# Patient Record
Sex: Male | Born: 1955 | Race: White | Hispanic: No | Marital: Married | Smoking: Former smoker
Health system: Southern US, Community
[De-identification: ages and names within clinical notes are randomized; demographics above are authoritative.]

## PROBLEM LIST (undated history)

## (undated) DIAGNOSIS — E119 Type 2 diabetes mellitus without complications: Secondary | ICD-10-CM

## (undated) DIAGNOSIS — K219 Gastro-esophageal reflux disease without esophagitis: Secondary | ICD-10-CM

## (undated) DIAGNOSIS — E782 Mixed hyperlipidemia: Secondary | ICD-10-CM

## (undated) DIAGNOSIS — F329 Major depressive disorder, single episode, unspecified: Secondary | ICD-10-CM

## (undated) DIAGNOSIS — R0600 Dyspnea, unspecified: Secondary | ICD-10-CM

## (undated) DIAGNOSIS — I10 Essential (primary) hypertension: Secondary | ICD-10-CM

## (undated) DIAGNOSIS — E785 Hyperlipidemia, unspecified: Secondary | ICD-10-CM

## (undated) DIAGNOSIS — I209 Angina pectoris, unspecified: Secondary | ICD-10-CM

## (undated) DIAGNOSIS — H905 Unspecified sensorineural hearing loss: Secondary | ICD-10-CM

## (undated) DIAGNOSIS — I2510 Atherosclerotic heart disease of native coronary artery without angina pectoris: Secondary | ICD-10-CM

## (undated) DIAGNOSIS — G4733 Obstructive sleep apnea (adult) (pediatric): Secondary | ICD-10-CM

## (undated) DIAGNOSIS — G4730 Sleep apnea, unspecified: Secondary | ICD-10-CM

## (undated) DIAGNOSIS — K449 Diaphragmatic hernia without obstruction or gangrene: Secondary | ICD-10-CM

## (undated) DIAGNOSIS — E7800 Pure hypercholesterolemia, unspecified: Secondary | ICD-10-CM

## (undated) DIAGNOSIS — H9319 Tinnitus, unspecified ear: Secondary | ICD-10-CM

## (undated) DIAGNOSIS — N529 Male erectile dysfunction, unspecified: Secondary | ICD-10-CM

## (undated) DIAGNOSIS — J189 Pneumonia, unspecified organism: Secondary | ICD-10-CM

## (undated) DIAGNOSIS — I700 Atherosclerosis of aorta: Secondary | ICD-10-CM

## (undated) DIAGNOSIS — R002 Palpitations: Secondary | ICD-10-CM

## (undated) DIAGNOSIS — E878 Other disorders of electrolyte and fluid balance, not elsewhere classified: Secondary | ICD-10-CM

## (undated) DIAGNOSIS — R011 Cardiac murmur, unspecified: Secondary | ICD-10-CM

## (undated) HISTORY — PX: OTHER SURGICAL HISTORY: SHX169

## (undated) HISTORY — PX: VASECTOMY: SHX75

## (undated) HISTORY — PX: COLONOSCOPY: SHX174

---

## 2011-07-17 ENCOUNTER — Emergency Department: Payer: Self-pay | Admitting: Emergency Medicine

## 2011-07-20 ENCOUNTER — Inpatient Hospital Stay: Payer: Self-pay | Admitting: Internal Medicine

## 2012-02-18 ENCOUNTER — Emergency Department: Payer: Self-pay | Admitting: Emergency Medicine

## 2012-02-18 LAB — URINALYSIS, COMPLETE
Glucose,UR: NEGATIVE mg/dL (ref 0–75)
Ketone: NEGATIVE
Leukocyte Esterase: NEGATIVE
Nitrite: NEGATIVE
Ph: 7 (ref 4.5–8.0)
Protein: NEGATIVE
RBC,UR: <1 /HPF (ref 0–5)
Squamous Epithelial: NONE SEEN

## 2013-05-25 IMAGING — CT CT ABD-PELV W/O CM
1 of 2 series · 15 of 32 positions shown, 19 images · non-contrast
Comparison: none

REASON FOR EXAM: (1) lower abd/pel celulitis tx with antibiotics post
[DATE]  lower mid /right abd
COMMENTS:

PROCEDURE:     CT  - CT ABDOMEN AND PELVIS W[DATE] [DATE]
RESULT:
TECHNIQUE: Helical noncontrast 3 mm sections were obtained from the lung
bases through the pubic symphysis.

[Series 2: 3mm soft tissue · axial · 0.68mm/px · z∈[-436,-20]mm · 15 of 153 slices shown, 19 images]
[im 7/153  soft-tissue]
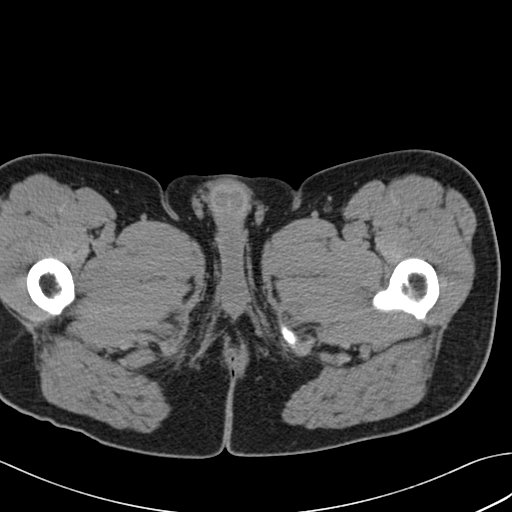
[im 7/153  bone]
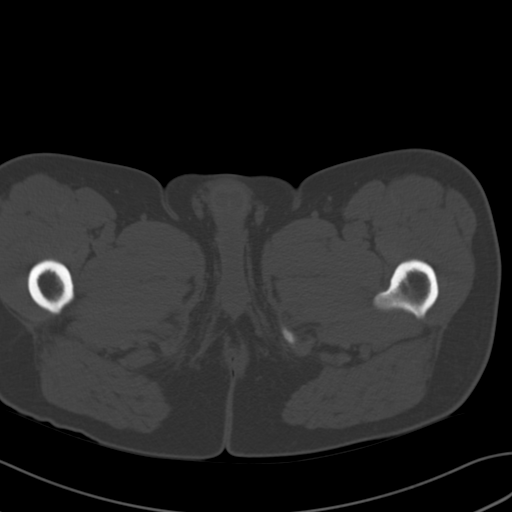
[im 21/153  soft-tissue]
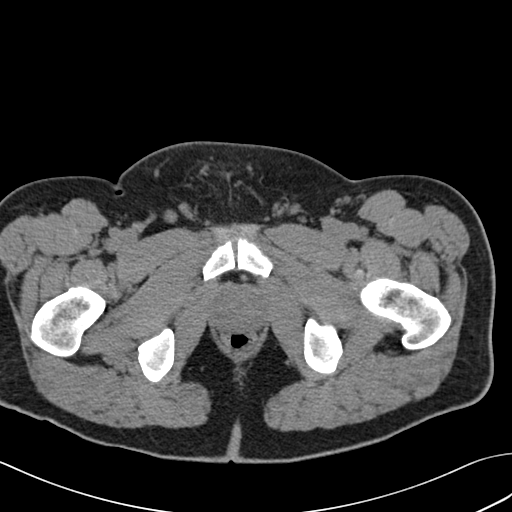
[im 35/153  soft-tissue]
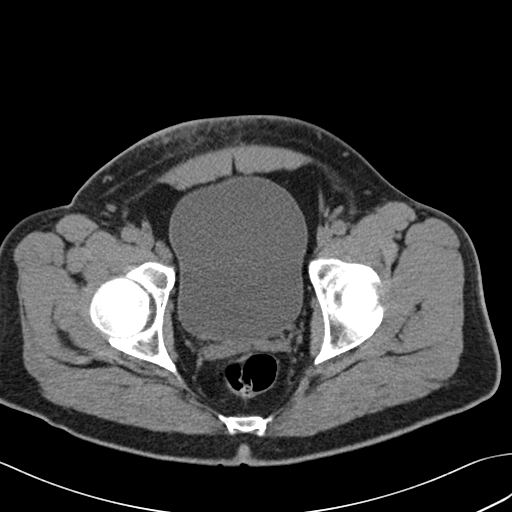
[im 42/153  soft-tissue]
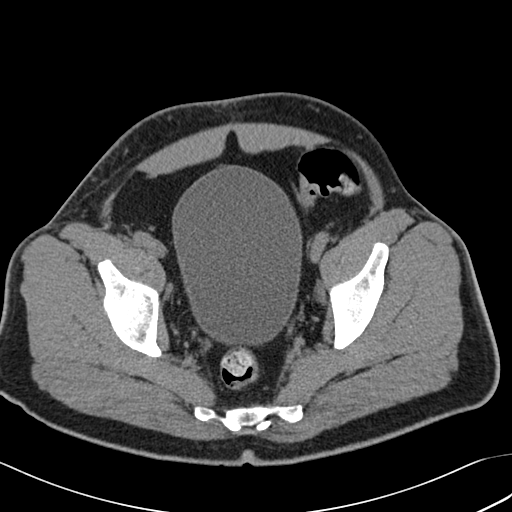
[im 56/153  soft-tissue]
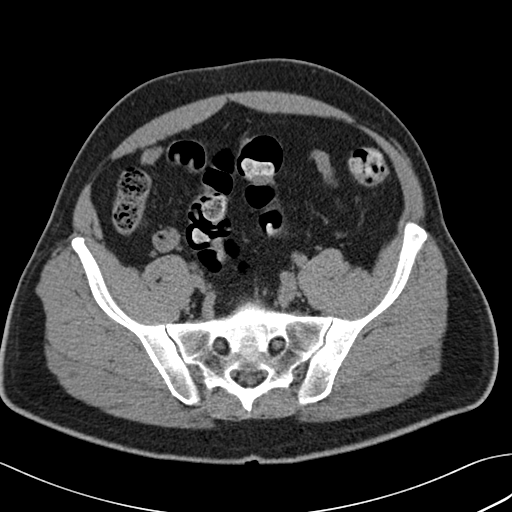
[im 63/153  soft-tissue]
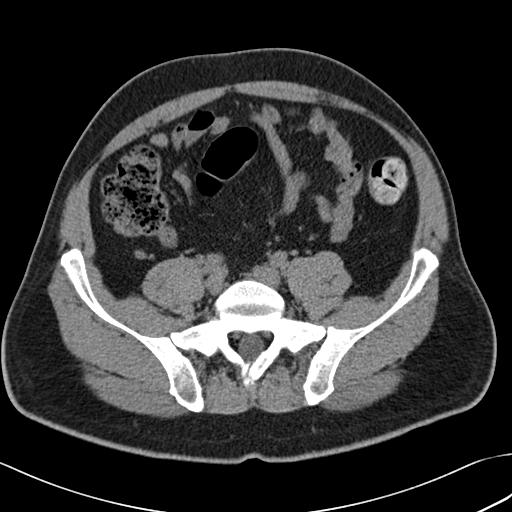
[im 77/153  soft-tissue]
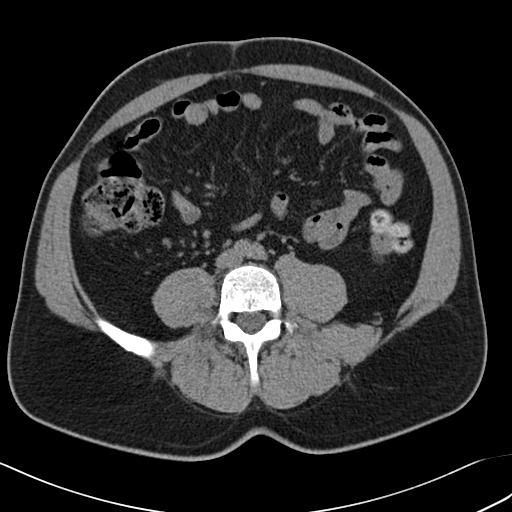
[im 90/153  soft-tissue]
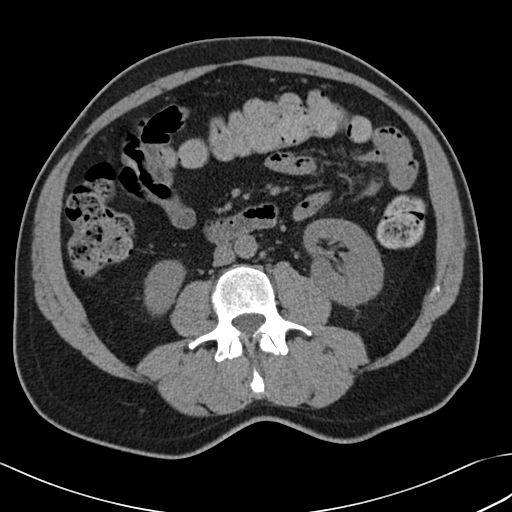
[im 97/153  soft-tissue]
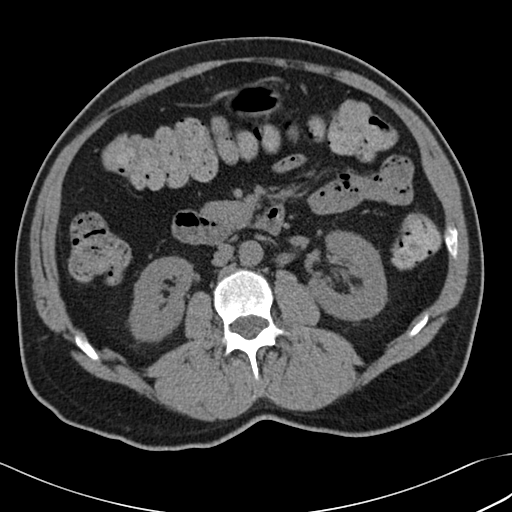
[im 97/153  bone]
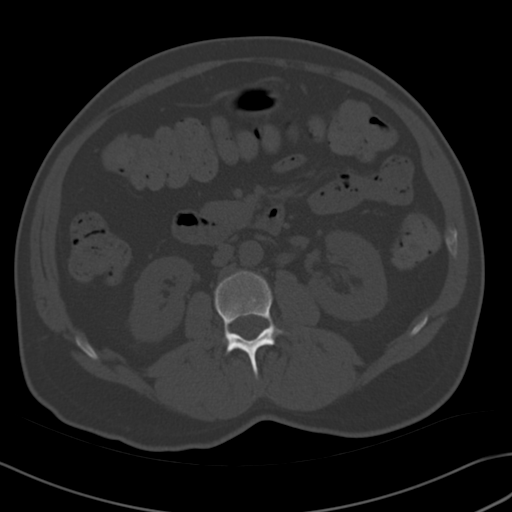
[im 111/153  soft-tissue]
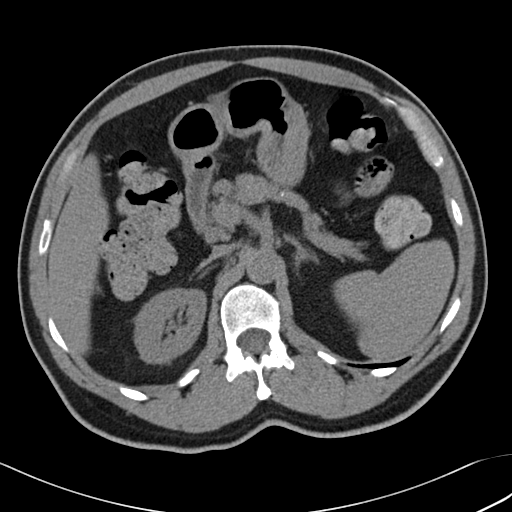
[im 118/153  soft-tissue]
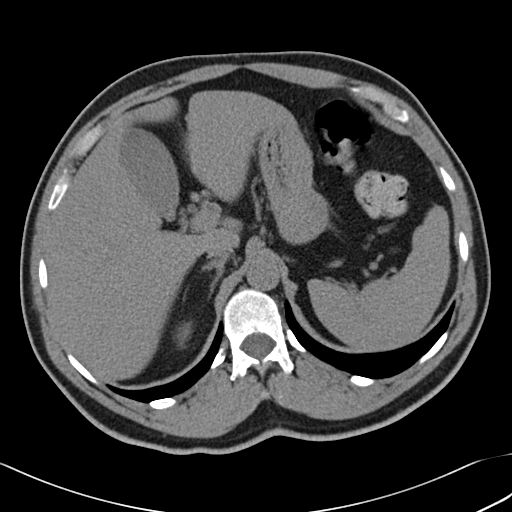
[im 125/153  lung]
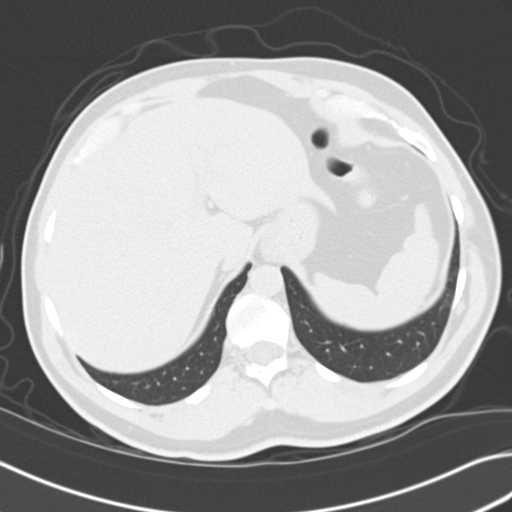
[im 132/153  soft-tissue]
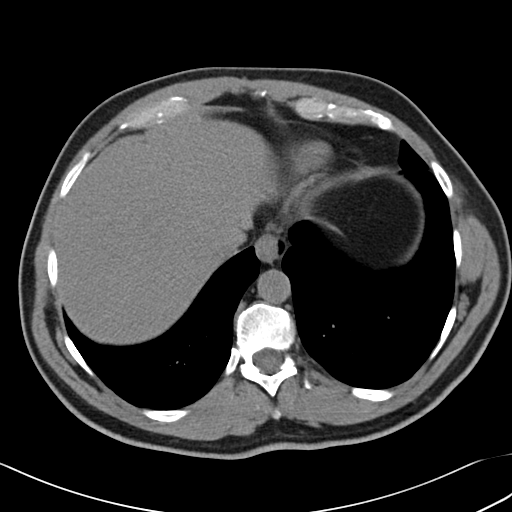
[im 132/153  lung]
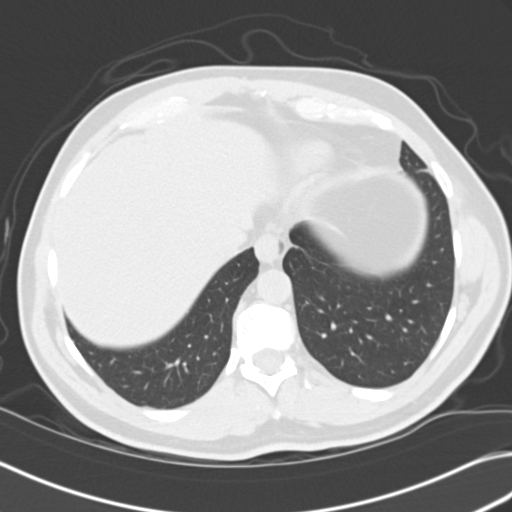
[im 139/153  lung]
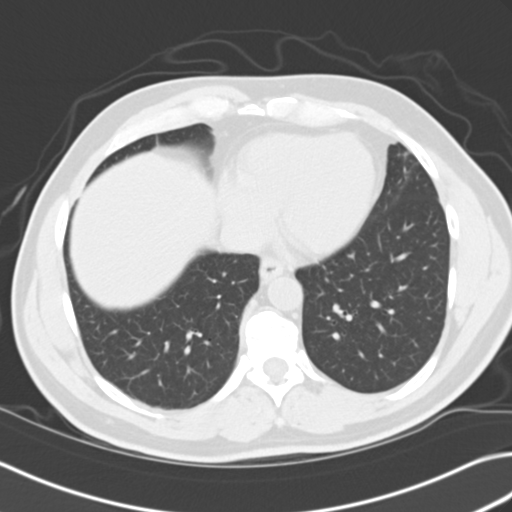
[im 146/153  soft-tissue]
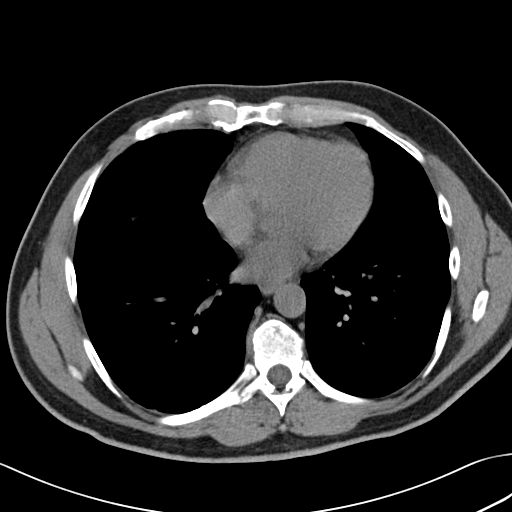
[im 146/153  lung]
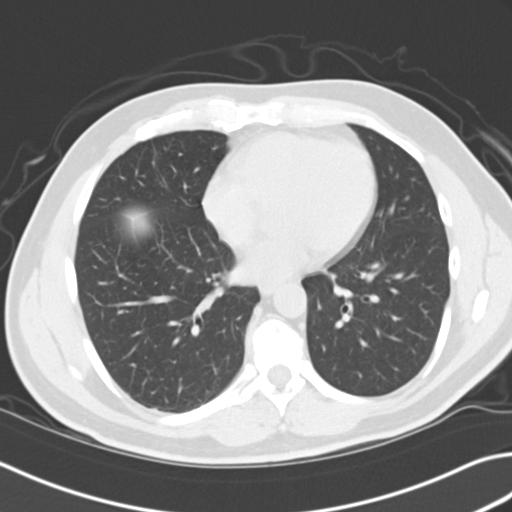

[15 of 32 positions shown; findings below may reference images not displayed]

FINDINGS: There are findings which may represent a small, 2 mm nodule within
the left lung base. The lung bases are otherwise unremarkable.

Noncontrast evaluation of the liver, spleen, adrenals, and pancreas is
unremarkable. A nonobstructing, small calculus is identified within the
medullary portion of the left kidney. The kidneys are otherwise
unremarkable. The appendix is identified and is unremarkable. There is no CT
evidence of bowel obstruction or secondary signs reflecting enteritis,
colitis, diverticulitis or appendicitis. A very small, fat containing,
umbilical hernia is identified.

Within the anterior lower right paracentral abdominal wall, a subcutaneous,
loculated, low attenuating focus is identified measuring 1.7 cm in diameter.
This may represent a small, subcutaneous abscess. There is inflammation of
the surrounding mesenteric fat.
IMPRESSION: 1.     Very small focus of induration and likely fluid collection in the
subcutaneous fat of the lower abdomen in the region of palpable concern.
There is surrounding mild cellulitis.
2.     Nonobstructing calculi.
3.     Very small, umbilical hernia.
4.     No further focal or acute abnormalities.
5.     Dr. Tiri of the Emergency Department was informed of these
findings via a preliminary faxed report.

## 2014-11-14 NOTE — Discharge Summary (Signed)
PATIENT NAME:  Danny Brown, Danny Brown MR#:  [id number] DATE OF BIRTH:  07-18-1956  DATE OF ADMISSION:  07/20/2011 DATE OF DISCHARGE:  07/21/2011  ADMITTING DIAGNOSIS:  Cellulitis and abscess in right lower abdominal wall.    DISCHARGE DIAGNOSES:   1. Right lower abdominal wall area abscess, status post incision and drainage on July 17, 2011.    2. Cultures pending.  3. Mild dehydration.  4. Renal insufficiency, resolved.   5. Hyperglycemia with hemoglobin A1c 6.6.   6. Hyperlipidemia, LDL 106.   7. History of gastroesophageal reflux disease.   DISCHARGE CONDITION: Stable.   DISCHARGE MEDICATIONS: The patient is to resume his outpatient medication which is: Protonix 40 mg p.o. daily.   ADDITIONAL MEDICATIONS:  1. Zyvox 600 mg p.o. twice daily for 7 days.  2. Augmentin 875 mg p.o. twice daily.  3. Percocet 5/325 mg 1 tablet p.o. every 4 hours as needed.  4. Patient is not to take Bactrim.    DIET:  His diet will be regular.    PHYSICAL ACTIVITY LIMITATIONS: As tolerated.   FOLLOWUP APPOINTMENT: With Dr. Michela Pitcher in 3 days after discharge.    CONSULTANTS: Dr. Excell Seltzer and Dr. Anda Kraft.   RADIOLOGIC STUDIES: CT of abdomen and pelvis without contrast on July 20, 2011 revealed very small focus of induration and likely fluid collection in the subcutaneous fat in the lower abdomen in the region of palpable concern.  There is surrounding mild cellulitis, nonobstructing calculi.  There is a small umbilical hernia. No further focal or acute abnormalities were noted.   The patient is a 59 year old Caucasian male with past medical history significant for history of gastroesophageal reflux disease who presented to the hospital with complaints of fevers, chills, as well as low anterolateral abdominal wall area pain and swelling.  Please refer to Dr. Elsie Saas Patel's admission on July 20, 2011.  Apparently the patient has been having symptoms for approximately 1 week. He was evaluated in the emergency  room on July 17, 2011 and noted to have cellulitis as well as abscess which was lanced; and wound cultures were taken.  He was discharged from the emergency room with antibiotic therapy with Bactrim, however, he presented back because of increasing swelling on arrival to the emergency room.  The second time his temperature is 98.2, pulse was 98, respiration rate 18, blood pressure 149/76. Saturation was 97% on room air. Physical exam revealed a moderate sized swelling in anterior abdominal wall consistent with abscess in lower part of the abdominal wall suprapubically.    PATIENT'S LABORATORY DATA:  July 19, 2011 showed glucose of 102, sodium 134, otherwise unremarkable BMP. The patient's liver enzymes were remarkable for elevated total protein to 8.6, otherwise unremarkable. CBC was within normal limits. Wound cultures July 19, 2011, did not show any growth.  Wound cultures July 17, 2011, were pending.    The patient was admitted to the hospital. He was started on Zosyn as well as vancomycin. With this therapy patient's condition significantly improved. He was evaluated by Dr. Excell Seltzer and followed by Dr. Anda Kraft.  Patient's wound broke and it drained copious amount of pus.  On day of discharge, July 21, 2011, patient felt satisfactory,  did not complain of any significant discomfort. His vitals were stable with temperature 97.8, pulse 74, respiratory rate 17, blood pressure 133/74, saturation 96% on room air at rest.   The patient was advised to continue antibiotic therapy for 7 more days with Zyvox as well as Augmentin. He is to follow  up with Dr. Michela PitcherEly as outpatient in the next few days and make decisions about changing antibiotics if necessary or discontinue antibiotics as necessary. The patient was rehydrated with IV fluids. Sodium level normalized from 134 to 140 on day of discharge, July 21, 2011.    The patient was noted to be hyperglycemic. The patient's hemoglobin A1c was  checked and was found to be 6.6 and it was suspected that patient has diabetes versus impaired glucose tolerance. It is recommended to get formal glucose tolerance test as outpatient to rule out or confirm diabetes mellitus.     The patient was noted to have mild renal insufficiency. On arrival to the hospital on July 19, 2011, the patient's creatinine was found to be 1.24.  The patient was  rehydrated and the patient's kidney function somewhat improved to 1.22 on the July 21, 2011.  It is recommended to follow the patient's BUN and creatinine levels to ensure that he is well hydrated, especially in view of possible diabetes mellitus concerns.   The patient lipid panel was checked.  Patient was noted to be hyperlipidemic with LDL level of 106. The patient's total cholesterol level was 162 and triglycerides were 152, and low HDL of 26.  The patient was advised to continue low-fat, low-cholesterol diet.    For gastroesophageal reflux disease the patient is to continue proton pump inhibitor.   The patient is being discharged in stable condition with the above-mentioned medications and followup.   TIME SPENT:  40 minutes.    VITAL SIGNS DAY OF DISCHARGE:  Temperature 97.8, pulse 74, respiratory rate 17, blood pressure 133/74, saturation 96% on room air at rest   ____________________________ Danny Caperima Karaline Buresh, MD rv:vtd D: 07/21/2011 19:42:36 ET T: 07/25/2011 13:07:31 ET JOB#: [id number]  cc: Danny Caperima Anvitha Hutmacher, MD, <Dictator> Carmie Endalph L. Ely III, MD Rhylee Pucillo MD ELECTRONICALLY SIGNED 09/02/2011 19:42

## 2014-11-14 NOTE — Consult Note (Signed)
PATIENT NAME:  Danny Brown, Ramone MR#:  [id number] DATE OF BIRTH:  [birth year]  DATE OF CONSULTATION:  07/20/2011  REFERRING PHYSICIAN:   CONSULTING PHYSICIAN:  Adah Salvageichard E. Excell Seltzerooper, MD  CHIEF COMPLAINT: Abdominal wall cellulitis.   HISTORY OF PRESENT ILLNESS: This is a patient who is three days status post incision and drainage of an abdominal wall abscess who presents back to the ER with worsening cellulitis and induration. Dr. Buford DresserFinnell called me to evaluate the patient for possible abscess. She has performed a CT scan which she reviews with me and stating that there is a 1.7 cm undrained fluid collection. Patient states that he drains fluid from this every day by expression. He has had some subjective fevers. He has not taken his temperature.   PAST MEDICAL HISTORY: Nondiabetic.   ALLERGIES: Penicillin.   FAMILY HISTORY: Noncontributory.   REVIEW OF SYSTEMS: A limited problem-focused review of systems was performed and negative with the exception of that mentioned in the history of present illness.   PHYSICAL EXAMINATION:  GENERAL: Healthy-appearing Caucasian male patient.   VITAL SIGNS: Temperature 98.2, pulse 98, respirations 18, blood pressure 149/76, 97% room air sat. Pain scale 0.   HEENT: No scleral icterus.   NECK: No palpable neck nodes.   ABDOMEN: Soft and nontender. There is an area in the suprapubic area measuring approximately 6 x 3 cm of induration without fluctuance. No expressible drainage. It is surrounded by fairly intense erythema and it is tender.   EXTREMITIES: Without edema. Calves are nontender.   NEUROLOGIC: Grossly intact.   LABORATORY, DIAGNOSTIC, AND RADIOLOGICAL DATA: White blood cell count 5.5, hemoglobin and hematocrit 16 and 47. Electrolytes show a sodium of 134, otherwise within normal limits.   ASSESSMENT AND RECOMMENDATIONS: This is a patient with possible small recurrent abscess but definitely has cellulitis, mostly induration. At this point I would  start him on IV antibiotics. Dr. Allena KatzPatel is admitting the patient and I have discussed him with both Dr. Allena KatzPatel and with Dr. Buford DresserFinnell in the ED. I will be happy to follow the patient if he does not promptly improve on IV antibiotics and drainage may be necessary of this very small 1.7 cm abscess.  ____________________________ Adah Salvageichard E. Excell Seltzerooper, MD rec:cms D: 07/20/2011 06:46:01 ET T: 07/20/2011 09:50:27 ET JOB#: [id number]  cc: Adah Salvageichard E. Excell Seltzerooper, MD, <Dictator> Lattie HawICHARD E Kalijah Westfall MD ELECTRONICALLY SIGNED 07/27/2011 12:40

## 2015-07-14 HISTORY — PX: PALATE / UVULA BIOPSY / EXCISION: SUR128

## 2015-09-29 ENCOUNTER — Ambulatory Visit
Admission: EM | Admit: 2015-09-29 | Discharge: 2015-09-29 | Disposition: A | Discharge status: Home / Self Care | Payer: BLUE CROSS/BLUE SHIELD | Attending: Family Medicine | Admitting: Family Medicine

## 2015-09-29 DIAGNOSIS — J111 Influenza due to unidentified influenza virus with other respiratory manifestations: Secondary | ICD-10-CM | POA: Diagnosis not present

## 2015-09-29 HISTORY — DX: Type 2 diabetes mellitus without complications: E11.9

## 2015-09-29 HISTORY — DX: Other disorders of electrolyte and fluid balance, not elsewhere classified: E87.8

## 2015-09-29 LAB — RAPID INFLUENZA A&B ANTIGENS (ARMC ONLY): INFLUENZA A (ARMC): NEGATIVE

## 2015-09-29 LAB — RAPID INFLUENZA A&B ANTIGENS: Influenza B (ARMC): NEGATIVE

## 2015-09-29 MED ORDER — BENZONATATE 100 MG PO CAPS: 200.0000 mg | ORAL_CAPSULE | Freq: Three times a day (TID) | ORAL | Status: DC | PRN

## 2015-09-29 MED ORDER — OSELTAMIVIR PHOSPHATE 75 MG PO CAPS: 75.0000 mg | ORAL_CAPSULE | Freq: Two times a day (BID) | ORAL | Status: DC

## 2015-09-29 MED ORDER — FEXOFENADINE-PSEUDOEPHED ER 180-240 MG PO TB24: 1.0000 | ORAL_TABLET | Freq: Every day | ORAL | Status: DC

## 2015-09-29 NOTE — ED Notes (Signed)
Patient c/o chills, body aches, and fever which all started yesterday morning.

## 2015-09-29 NOTE — Discharge Instructions (Signed)
Influenza, Adult °Influenza (flu) is an infection in the mouth, nose, and throat (respiratory tract) caused by a virus. The flu can make you feel very ill. Influenza spreads easily from person to person (contagious).  °HOME CARE  °· Only take medicines as told by your doctor. °· Use a cool mist humidifier to make breathing easier. °· Get plenty of rest until your fever goes away. This usually takes 3 to 4 days. °· Drink enough fluids to keep your pee (urine) clear or pale yellow. °· Cover your mouth and nose when you cough or sneeze. °· Wash your hands well to avoid spreading the flu. °· Stay home from work or school until your fever has been gone for at least 1 full day. °· Get a flu shot every year. °GET HELP RIGHT AWAY IF:  °· You have trouble breathing or feel short of breath. °· Your skin or nails turn blue. °· You have severe neck pain or stiffness. °· You have a severe headache, facial pain, or earache. °· Your fever gets worse or keeps coming back. °· You feel sick to your stomach (nauseous), throw up (vomit), or have watery poop (diarrhea). °· You have chest pain. °· You have a deep cough that gets worse, or you cough up more thick spit (mucus). °MAKE SURE YOU:  °· Understand these instructions. °· Will watch your condition. °· Will get help right away if you are not doing well or get worse. °  °This information is not intended to replace advice given to you by your health care provider. Make sure you discuss any questions you have with your health care provider. °  °Document Released: 04/17/2008 Document Revised: 07/30/2014 Document Reviewed: 10/08/2011 °Elsevier Interactive Patient Education ©2016 [employer] °Influenza Tests °WHY AM I HAVING THIS TEST? °You may have an influenza test to help your health care provider determine what type of respiratory infection you have. The test may also be used to help determine a treatment plan and to monitor influenza activity within a community. °There are two  types of influenza virus: types A and B. Often, one strain of type A influenza will be the most common type of influenza in a community during flu season. This is typically between the months of October and May. Influenza tests can help determine which strain of influenza type A is occurring most often in the community. °WHAT KIND OF SAMPLE IS TAKEN? °Influenza tests are performed by collecting a small sample of fluids (secretions) from your nose or throat using a cotton swab. Tests performed on nasal secretions are more accurate than tests performed on a sample taken from your throat. °· Rapid influenza tests are available and have become the most frequently used tests for influenza. They are most accurate when completed within the first 48 hours after your symptoms begin. °· Depending on the method, a rapid influenza test may be completed in your health care provider's office in less than 30 minutes. It can also be sent to a lab with the results available the same day. °· Depending on the particular type of test used, it can identify influenza type A, a mixture of types A and B, or differentiate between type A and B. °· Another test that your health care provider may order is a viral culture. This also requires the collection of secretions from your nose or throat. The sample is then sent to a lab for processing. This may take several days to complete. °HOW ARE YOUR TEST RESULTS   REPORTED? °Your test results will be reported as either positive or negative. A false-negative result can occur. A false-negative result is incorrect because it indicates a condition or finding is not present when it is. °It is your responsibility to obtain your test results. Ask the lab or department performing the test when and how you will get your results. °WHAT DO THE RESULTS MEAN? °· A positive test means you have influenza. Tests may further determine the type of influenza you have. °· A negative influenza test result means it is  not likely that you have influenza. °· A false-negative result can occur. False-negative results are more likely to happen at the height of the influenza season. °Talk with your health care provider to discuss your results, treatment options, and if necessary, the need for more tests. Talk with your health care provider if you have any questions about your results. °  °This information is not intended to replace advice given to you by your health care provider. Make sure you discuss any questions you have with your health care provider. °  °Document Released: 04/18/2005 Document Revised: 07/30/2014 Document Reviewed: 11/25/2013 °Elsevier Interactive Patient Education ©2016 [employer] ° °

## 2015-09-29 NOTE — ED Provider Notes (Signed)
CSN: [id number]     Arrival date & time 09/29/15  1257 History   First MD Initiated Contact with Patient 09/29/15 1547    Nurses notes were reviewed. Chief Complaint  Patient presents with  . URI   Patient is here because of said aching and pain fever and general malaise. He states everything started last night and today while at work he got real bad as far symptoms concerned. He reports though that things did get better after taking Tylenol this morning and then later this afternoon. He states that he does feel better right now but another coworker left because the fluid. He has diabetes and hypercholesterolemia. Surgeries  uvula removed. He states he stopped smoking about 1020 years ago.  (Consider location/radiation/quality/duration/timing/severity/associated sxs/prior Treatment) Patient is a 60 y.o. male presenting with URI. The history is provided by the patient. No language interpreter was used.  URI Presenting symptoms: congestion, fever and rhinorrhea   Severity:  Moderate Timing:  Constant Progression:  Worsening Worsened by:  Nothing tried Associated symptoms: myalgias   Associated symptoms: no sneezing and no swollen glands   Risk factors: being elderly and diabetes mellitus   Risk factors: no recent illness and no recent travel     Past Medical History  Diagnosis Date  . Diabetes mellitus without complication (HCC)   . Hypochloremia    Past Surgical History  Procedure Laterality Date  . Uvula removed     History reviewed. No pertinent family history. Social History  Substance Use Topics  . Smoking status: Never Smoker   . Smokeless tobacco: Never Used  . Alcohol Use: Yes     Comment: Seldom    Review of Systems  Constitutional: Positive for fever.  HENT: Positive for congestion and rhinorrhea. Negative for sneezing.   Musculoskeletal: Positive for myalgias.  All other systems reviewed and are negative.   Allergies  Penicillins  Home Medications   Prior  to Admission medications   Medication Sig Start Date End Date Taking? Authorizing Provider  enalapril (VASOTEC) 2.5 MG tablet Take 2.5 mg by mouth daily.   Yes Historical Provider, MD  metFORMIN (GLUCOPHAGE) 500 MG tablet Take 500 mg by mouth daily.   Yes Historical Provider, MD  benzonatate (TESSALON) 100 MG capsule Take 2 capsules (200 mg total) by mouth 3 (three) times daily as needed for cough. 09/29/15   Hassan Rowan, MD  fexofenadine-pseudoephedrine (ALLEGRA-D ALLERGY & CONGESTION) 180-240 MG 24 hr tablet Take 1 tablet by mouth daily. 09/29/15   Hassan Rowan, MD  oseltamivir (TAMIFLU) 75 MG capsule Take 1 capsule (75 mg total) by mouth 2 (two) times daily. 09/29/15   Hassan Rowan, MD   Meds Ordered and Administered this Visit  Medications - No data to display  BP 136/75 mmHg  Pulse 92  Temp(Src) 99.4 F (37.4 C) (Oral)  Resp 18  Ht  (1.6 m)  Wt 155 lb (70.308 kg)  BMI 27.46 kg/m2  SpO2 100% No data found.   Physical Exam  Constitutional: He is oriented to person, place, and time. He appears well-developed.  HENT:  Head: Normocephalic and atraumatic.  Right Ear: Hearing, tympanic membrane, external ear and ear canal normal.  Left Ear: Hearing, tympanic membrane, external ear and ear canal normal.  Nose: Mucosal edema and rhinorrhea present.  Mouth/Throat: Uvula is midline. No dental caries.  Eyes: Conjunctivae are normal. Pupils are equal, round, and reactive to light.  Neck: Normal range of motion. Neck supple.  Cardiovascular: Normal rate and regular  rhythm.   Pulmonary/Chest: Effort normal and breath sounds normal.  Musculoskeletal: Normal range of motion.  Lymphadenopathy:    He has cervical adenopathy.  Neurological: He is alert and oriented to person, place, and time.  Skin: Skin is warm and dry.  Vitals reviewed.   ED Course  Procedures (including critical care time)  Labs Review Labs Reviewed  RAPID INFLUENZA A&B ANTIGENS [facility] ONLY)    Imaging Review No  results found.   Visual Acuity Review  Right Eye Distance:   Left Eye Distance:   Bilateral Distance:    Right Eye Near:   Left Eye Near:    Bilateral Near:      Results for orders placed or performed during the hospital encounter of 09/29/15  Rapid Influenza A&B Antigens ([facility] only)  Result Value Ref Range   Influenza A ([facility]) NEGATIVE NEGATIVE   Influenza B ([facility]) NEGATIVE NEGATIVE    MDM   1. Flu syndrome    Even though flu test was negative will treat him for flulike illness especially since been less than 24 hours since the illness started. Work note for today and tomorrow. Tamiflu 75 mg twice a day Allegra-D if needed and Tessalon Perles 200 mg 2 times a day as needed follow-up PCP not better in 1-2 weeks and should be noted the patient is a diabetic.  Note: This dictation was prepared with Dragon dictation along with smaller phrase technology. Any transcriptional errors that result from this process are unintentional.    Hassan RowanEugene Treyce Spillers, MD 09/29/15 [id number]

## 2022-04-16 ENCOUNTER — Other Ambulatory Visit: Payer: Self-pay | Admitting: Physician Assistant

## 2022-04-16 DIAGNOSIS — M542 Cervicalgia: Secondary | ICD-10-CM

## 2022-04-27 ENCOUNTER — Ambulatory Visit
Admission: RE | Admit: 2022-04-27 | Discharge: 2022-04-27 | Disposition: A | Discharge status: Home / Self Care | Payer: Medicare Other | Source: Ambulatory Visit | Attending: Physician Assistant | Admitting: Physician Assistant

## 2022-04-27 DIAGNOSIS — M542 Cervicalgia: Secondary | ICD-10-CM

## 2022-09-10 ENCOUNTER — Ambulatory Visit: Payer: BC Managed Care – PPO | Attending: Physical Medicine & Rehabilitation

## 2022-09-10 DIAGNOSIS — M542 Cervicalgia: Secondary | ICD-10-CM | POA: Diagnosis present

## 2022-09-10 NOTE — Therapy (Signed)
OUTPATIENT PHYSICAL THERAPY CERVICAL EVALUATION   Patient Name: Danny Brown MRN: [id number] DOB:[birth year], 67 y.o., male Today's Date: 09/10/2022  END OF SESSION:  PT End of Session - 09/10/22 1734     Visit Number 1    PT Start Time 35    PT Stop Time [id number]    PT Time Calculation (min) 45 min    Activity Tolerance Patient tolerated treatment well    Behavior During Therapy WFL for tasks assessed/performed             Past Medical History:  Diagnosis Date   Diabetes mellitus without complication ([place])    Hypochloremia    Past Surgical History:  Procedure Laterality Date   Uvula Removed     There are no problems to display for this patient.   PCP: [facility] [place]  REFERRING PROVIDER: Dr. Girtha Hake, MD  REFERRING DIAG: Cervicalgia  THERAPY DIAG:  Cervicalgia  Rationale for Evaluation and Treatment: Rehabilitation  ONSET DATE: 5-6 years ago  SUBJECTIVE:                                                                                                                                                                                                         SUBJECTIVE STATEMENT: Pt initially saw orthopedist- Dr. Eliberto Ivory for his R shoulder (Dec 2023) and got treatment for R shoulder (injection) and that is doing well.  Pt states that physician took some images of cervical spine and referred pt to Dr. Alba Destine.  He had an epidural injection (he is not sure the exact procedure) and reports he felt better immediately.  Prior to the injection he was having bilateral hand numbness and this has significantly improved.  His neck is feeling much better, just having some mild stiffness.  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  He has some computer screens he has to look between at his work, he stands but can sit if needed.  He uses a hand truck when he  has to move heavy items at work.  He has 2 dogs and enjoys walking them and walking in general. He lives at home with his wife.    PERTINENT HISTORY:  Pt reports having a cervical spine epidural steroid injection on Friday last week.  PAIN:  Are you having pain? No  PRECAUTIONS: None  WEIGHT BEARING RESTRICTIONS: No  FALLS:  Has patient fallen in last 6 months? No  LIVING ENVIRONMENT: Lives with: lives with their spouse Lives in: House/apartment Stairs:  n/a Has following equipment at home: None  OCCUPATION: Works in the parts department at [employer]: to learn a HEP for his neck today that he will help him keep his neck moving and strong.  He would like to perform this independently after today.  NEXT MD VISIT: none scheduled with Dr. Alba Destine, just went on Friday for his follow up.    OBJECTIVE:   DIAGNOSTIC FINDINGS:  Pt had Cervical Spine MRI 04/28/22 prior to his injection.  Per chart review, findings: 1. Right paracentral disc protrusion at C4-5 with resultant mild spinal stenosis. 2. Degenerative spondylosis at C5-6 and C6-7 with resultant mild spinal stenosis, with moderate right C6 foraminal narrowing, with mild left C6 and C7 foraminal stenosis. 3. Multilevel facet hypertrophy as above, most pronounced at C2-3 and C7-T1 on the left. Findings could contribute to neck pain.  Dr. Alba Destine also did her own review of the images (see referral scanned in chart).  PATIENT SURVEYS:  FOTO 78/76  COGNITION: Overall cognitive status: Within functional limits for tasks assessed  SENSATION: WFL  POSTURE: rounded shoulders  PALPATION: (-) TTP cervical spine mm   CERVICAL ROM:   Active ROM A/PROM (deg) eval  Flexion 30  Extension 42  Right lateral flexion   Left lateral flexion   Right rotation 44  Left rotation 43   (Blank rows = not tested)  UPPER EXTREMITY ROM:  Active ROM Right eval Left eval  Shoulder flexion  165 165  Shoulder extension    Shoulder abduction    Shoulder adduction    Shoulder extension    Shoulder internal rotation    Shoulder external rotation    Elbow flexion    Elbow extension    Wrist flexion    Wrist extension    Wrist ulnar deviation    Wrist radial deviation    Wrist pronation    Wrist supination     (Blank rows = not tested)  UPPER EXTREMITY MMT:  MMT Right eval Left eval  Shoulder flexion 5 5  Shoulder extension    Shoulder abduction 5 5  Shoulder adduction    Shoulder extension    Shoulder internal rotation    Shoulder external rotation    Middle trapezius    Lower trapezius    Elbow flexion 5 5  Elbow extension    Wrist flexion    Wrist extension 5 5  Wrist ulnar deviation    Wrist radial deviation    Wrist pronation    Wrist supination    Grip strength 63.8 lb 65.1 lb   (Blank rows = not tested)  CERVICAL SPECIAL TESTS:  In tact and equal sensation to light touch C2-T1 dermatomes 5/5 Cervical myotome testing b/l today Pt has difficulty with performing scapular mm retraction- lacks endurance/motor control Pt has difficulty with performing deep neck flexor mm activation- lacks endurance/motor control   TODAY'S TREATMENT:  DATE: 09/10/22 Therapeutic Exercise:  Cervical Spine ROM: focused on upper cervical flexion with lower cervical extension via a chin tuck + retraction standing at the wall with cues to keep eyes level; 5 sec holds x 10 Self levator scap stretch: 3x10 sec each side Seated thoracic extension in chair x5 Standing "W"  Scapular retraction with ER x 15, green tband Standing row with scapular retraction x15, green tband   PATIENT EDUCATION:  Education details: HEP instruction, taking movement breaks and continuing to incorporate walking into his daily routine; neutral spine for lifting (to avoid straining  neck in a position of extension) Person educated: Patient Education method: Explanation, Tactile cues, Verbal cues, and Handouts Education comprehension: verbalized understanding and returned demonstration  HOME EXERCISE PROGRAM: Access Code: [email or web address] URL: [email or web address] Date: 09/10/2022 Prepared by: Merdis Delay  Exercises - Standing Cervical Retraction  - 1-2 x daily - 7 x weekly - 3 sets - 3 reps - 10 hold - Seated Levator Scapulae Stretch  - 1-2 x daily - 7 x weekly - 3 reps - 10 hold - Shoulder External Rotation and Scapular Retraction with Resistance  - 1 x daily - 4 x weekly - 2 sets - 15 reps - Standing Shoulder Row with Anchored Resistance  - 1 x daily - 4 x weekly - 2 sets - 15 reps  ASSESSMENT:  CLINICAL IMPRESSION: Patient is a pleasant 67 y.o. M who was seen today for physical therapy evaluation and treatment for cervicalgia.  He is very pleased with his current status as he reports full resolution of his neck and UE sx since undergoing cervical spine injection last week.  He is interested in learning a home exercise program today which he plans to continue working on independently.  He does not wish to pursue a formal course of PT at this point in time.  I instructed him on a HEP for cervical spine ROM and cervical spine/postural mm retraining; also discussed body mechanics for cervical spine during daily activities.  He was able to perform exercises correctly and was given handouts with written and visual instructions.  He expressed he is not interested in scheduling additional treatment at this time and will reach out in the future to complete additional visits if necessary.  He is welcome to return again in the future if needed.    OBJECTIVE IMPAIRMENTS: decreased ROM and decreased strength.   ACTIVITY LIMITATIONS:  none  PARTICIPATION LIMITATIONS:  none  PERSONAL FACTORS: Time since onset of injury/illness/exacerbation are also affecting patient's  functional outcome.   REHAB POTENTIAL: Excellent  CLINICAL DECISION MAKING: Stable/uncomplicated  EVALUATION COMPLEXITY: Low   GOALS: Goals reviewed with patient? Yes  SHORT TERM GOALS: Target date: 09/10/22  Pt will be instructed in a HEP for cervical spine ROM and strength which he can perform independently Baseline: completed during today's session, met goal.  No additional goals established. Goal status: INITIAL  LONG TERM GOALS: Target date: No long term goals established as pt would like to work on HEP independently after today.    PLAN:  PT FREQUENCY:  1x visit.  Pt would like to continue with his HEP independently and not schedule a formal follow up today.  PT DURATION: other: 1x visit.  PLANNED INTERVENTIONS: Therapeutic exercises, Therapeutic activity, and Self Care  PLAN FOR NEXT SESSION: No further follow up scheduled; pt states he would like to work on his HEP independently.   Pincus Badder, PT 09/10/2022, 6:04 PM Merdis Delay, PT, DPT, OCS  #  [id number]      

## 2022-09-12 ENCOUNTER — Ambulatory Visit: Payer: BC Managed Care – PPO

## 2022-12-31 ENCOUNTER — Encounter: Payer: Self-pay | Admitting: Urology

## 2022-12-31 ENCOUNTER — Ambulatory Visit (INDEPENDENT_AMBULATORY_CARE_PROVIDER_SITE_OTHER): Payer: BC Managed Care – PPO | Admitting: Urology

## 2022-12-31 VITALS — BP 158/89 | HR 105 | Ht 63.0 in | Wt 165.0 lb

## 2022-12-31 DIAGNOSIS — N529 Male erectile dysfunction, unspecified: Secondary | ICD-10-CM

## 2022-12-31 MED ORDER — TADALAFIL 20 MG PO TABS: ORAL_TABLET | ORAL | 0 refills | Status: DC

## 2022-12-31 NOTE — Progress Notes (Signed)
I, Maysun L Gibbs,acting as a scribe for Riki Altes, MD.,have documented all relevant documentation on the behalf of Riki Altes, MD,as directed by  Riki Altes, MD while in the presence of Riki Altes, MD.  12/31/2022 12:07 PM   Danny Brown 11-15-1955 [id number]  Referring provider: Leim Fabry, MD [address] [place],  [place] [address]  Chief Complaint  Patient presents with   Erectile Dysfunction    HPI: Danny Brown is a 67 y.o. male referred for evaluation of erectile dysfunction.  Greater than 1 year history of the difficulty maintaining an erection.  Typically does not have problems achieving an erection and usually can achieve penetration, though has difficulty maintaining the erection and difficult achieving climax.  Denies pain or curvature with erections.  Does have tiredness, fatigue. Has tried generic Sildenafil, though he has bothersome headache and does not usually take.  When he was taking the sildenafil, saw no improvement.  Organic risk factors include diabetes, hypertension, hypercholesterolemia, and antihypertensive medications.  No prior tobacco use. Also complains of decreased penile sensitivity.   PMH: Past Medical History:  Diagnosis Date   Diabetes mellitus without complication ([facility])    Hypochloremia     Surgical History: Past Surgical History:  Procedure Laterality Date   Uvula Removed      Home Medications:  Allergies as of 12/31/2022       Reactions   Atorvastatin Other (See Comments)   Penicillins Itching        Medication List        Accurate as of December 31, 2022 12:07 PM. If you have any questions, ask your nurse or doctor.          STOP taking these medications    enalapril 2.5 MG tablet Commonly known as: VASOTEC Stopped by: Riki Altes, MD   oseltamivir 75 MG capsule Commonly known as: Tamiflu Stopped by: Riki Altes, MD   sildenafil 50 MG tablet Commonly known as:  VIAGRA Stopped by: Riki Altes, MD       TAKE these medications    benzonatate 100 MG capsule Commonly known as: TESSALON Take 2 capsules (200 mg total) by mouth 3 (three) times daily as needed for cough.   fexofenadine-pseudoephedrine 180-240 MG 24 hr tablet Commonly known as: Allegra-D Allergy & Congestion Take 1 tablet by mouth daily.   metFORMIN 500 MG tablet Commonly known as: GLUCOPHAGE Take 500 mg by mouth daily.   tadalafil 20 MG tablet Commonly known as: CIALIS Take 1 tab 1 hour per to intercourse Started by: Riki Altes, MD   Vilazodone HCl 40 MG Tabs Commonly known as: VIIBRYD Take by mouth.        Allergies:  Allergies  Allergen Reactions   Atorvastatin Other (See Comments)   Penicillins Itching    Social History:  reports that he has never smoked. He has never used smokeless tobacco. He reports current alcohol use. No history on file for drug use.   Physical Exam: BP (!) 158/89   Pulse (!) 105   Ht 5\' 3"  (1.6 m)   Wt 165 lb (74.8 kg)   BMI 29.23 kg/m   Constitutional:  Alert and oriented, No acute distress. HEENT: [place] AT Respiratory: Normal respiratory effort, no increased work of breathing. GU: Phallus circumcised without lesions. Testes descended bilaterally without massive or tenderness, normal testicular size bilaterally.  Psychiatric: Normal mood and affect.   Assessment & Plan:    1. Erectile  dysfunction We discussed 70% of ED is related to arterial insufficiency. 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  Trial Tadalafil 20 mg, 1 hour prior to intercourse. Further recommendations after lab review  I have reviewed the above documentation for accuracy and completeness, and I agree with the above.   Riki Altes, MD  [facility] [address] [place],  [place] [address] [phone]

## 2022-12-31 NOTE — Patient Instructions (Signed)
[facility] 

## 2023-01-04 ENCOUNTER — Other Ambulatory Visit: Payer: Self-pay | Admitting: *Deleted

## 2023-01-04 DIAGNOSIS — N529 Male erectile dysfunction, unspecified: Secondary | ICD-10-CM

## 2023-01-07 ENCOUNTER — Other Ambulatory Visit: Payer: Medicare Other

## 2023-01-07 DIAGNOSIS — N529 Male erectile dysfunction, unspecified: Secondary | ICD-10-CM

## 2023-01-08 LAB — LUTEINIZING HORMONE: LH: 6.3 m[IU]/mL (ref 1.7–8.6)

## 2023-01-08 LAB — TESTOSTERONE: Testosterone: 327 ng/dL (ref 264–916)

## 2023-01-09 LAB — SPECIMEN STATUS REPORT

## 2023-01-10 LAB — TESTOSTERONE,FREE AND TOTAL
Testosterone, Free: 5.3 pg/mL — ABNORMAL LOW (ref 6.6–18.1)
Testosterone: 323 ng/dL (ref 264–916)

## 2023-01-14 ENCOUNTER — Other Ambulatory Visit: Payer: Self-pay | Admitting: *Deleted

## 2023-01-14 DIAGNOSIS — N529 Male erectile dysfunction, unspecified: Secondary | ICD-10-CM

## 2023-01-14 DIAGNOSIS — R972 Elevated prostate specific antigen [PSA]: Secondary | ICD-10-CM

## 2023-01-17 ENCOUNTER — Other Ambulatory Visit: Payer: Medicare Other

## 2023-01-17 DIAGNOSIS — R972 Elevated prostate specific antigen [PSA]: Secondary | ICD-10-CM

## 2023-01-18 LAB — PSA TOTAL (REFLEX TO FREE): Prostate Specific Ag, Serum: 0.6 ng/mL (ref 0.0–4.0)

## 2023-01-19 ENCOUNTER — Encounter: Payer: Self-pay | Admitting: Urology

## 2023-02-09 LAB — SPECIMEN STATUS REPORT

## 2023-02-09 LAB — TESTOSTERONE,FREE AND TOTAL
Testosterone, Free: 6.7 pg/mL (ref 6.6–18.1)
Testosterone: 418 ng/dL (ref 264–916)

## 2023-02-26 ENCOUNTER — Other Ambulatory Visit: Payer: Self-pay | Admitting: *Deleted

## 2023-02-26 ENCOUNTER — Encounter: Payer: Self-pay | Admitting: Urology

## 2023-02-26 MED ORDER — TADALAFIL 20 MG PO TABS: ORAL_TABLET | ORAL | 1 refills | Status: DC

## 2023-03-21 ENCOUNTER — Encounter: Payer: Self-pay | Admitting: Urology

## 2023-03-21 ENCOUNTER — Ambulatory Visit (INDEPENDENT_AMBULATORY_CARE_PROVIDER_SITE_OTHER): Payer: BC Managed Care – PPO | Admitting: Urology

## 2023-03-21 VITALS — BP 123/73 | HR 88 | Ht 63.0 in | Wt 155.0 lb

## 2023-03-21 DIAGNOSIS — N401 Enlarged prostate with lower urinary tract symptoms: Secondary | ICD-10-CM | POA: Diagnosis not present

## 2023-03-21 DIAGNOSIS — N529 Male erectile dysfunction, unspecified: Secondary | ICD-10-CM | POA: Diagnosis not present

## 2023-03-21 DIAGNOSIS — N5319 Other ejaculatory dysfunction: Secondary | ICD-10-CM | POA: Diagnosis not present

## 2023-03-21 DIAGNOSIS — R972 Elevated prostate specific antigen [PSA]: Secondary | ICD-10-CM

## 2023-03-21 LAB — BLADDER SCAN AMB NON-IMAGING

## 2023-03-21 MED ORDER — TADALAFIL 5 MG PO TABS: 5.0000 mg | ORAL_TABLET | Freq: Every day | ORAL | 3 refills | Status: DC | PRN

## 2023-03-21 MED ORDER — TADALAFIL 20 MG PO TABS: 20.0000 mg | ORAL_TABLET | Freq: Every day | ORAL | 3 refills | Status: DC | PRN

## 2023-03-21 NOTE — Progress Notes (Signed)
03/21/2023 2:17 PM   Danny Brown [birth year] [id number]  Referring provider: [facility] [address] [place],  [place] [address]  Urological history: 1. Erectile dysfunction -contributing factors of age, diabetes, HTN, HLD and BP meds.  -sildenafil - headaches  -tadalafil 20 mg, on-demand-dosing  Chief Complaint  Patient presents with   Dysuria   HPI: Danny Brown is a 67 y.o. male who presents today for urinary hesitancy, weak urinary stream and dysuria.  Previous records reviewed.   Over the weekend, he started to experience nocturia, hesitancy, intermittency and a weak urinary stream.  He is also having some dysuria with it just before the stream starts.  He had been experiencing post-void dribbling for several months prior to this.  Patient denies any modifying or aggravating factors.  Patient denies any recent UTI's, gross hematuria, dysuria or suprapubic/flank pain.  Patient denies any fevers, chills, nausea or vomiting.  He is also having issues with delayed ejaculation.   PSA (12/2022) 0.6   I PSS 19/3  UA yellow clear, specific area less than 1.005, pH 6.0, 0-5 WBCs and 0-10 epithelial cells.  PVR 147 mL    IPSS     Row Name 03/21/23 1300         International Prostate Symptom Score   How often have you had the sensation of not emptying your bladder? Less than half the time     How often have you had to urinate less than every two hours? About half the time     How often have you found you stopped and started again several times when you urinated? About half the time     How often have you found it difficult to postpone urination? About half the time     How often have you had a weak urinary stream? About half the time     How often have you had to strain to start urination? Less than half the time     How many times did you typically get up at night to urinate? 3 Times     Total IPSS Score 19       Quality of Life due to urinary  symptoms   If you were to spend the rest of your life with your urinary condition just the way it is now how would you feel about that? Mixed              Score:  1-7 Mild 8-19 Moderate 20-35 Severe    PMH: Past Medical History:  Diagnosis Date   Diabetes mellitus without complication (H[facility])    Hypochloremia     Surgical History: Past Surgical History:  Procedure Laterality Date   Uvula Removed      Home Medications:  Allergies as of 03/21/2023       Reactions   Atorvastatin Other (See Comments)   Penicillins Itching        Medication List        Accurate as of March 21, 2023  2:17 PM. If you have any questions, ask your nurse or doctor.          benzonatate 100 MG capsule Commonly known as: TESSALON Take 2 capsules (200 mg total) by mouth 3 (three) times daily as needed for cough.   fexofenadine-pseudoephedrine 180-240 MG 24 hr tablet Commonly known as: Allegra-D Allergy & Congestion Take 1 tablet by mouth daily.   metFORMIN 500 MG tablet Commonly known as: GLUCOPHAGE Take 500 mg by mouth  daily.   tadalafil 20 MG tablet Commonly known as: CIALIS Take 1 tab 1 hour per to intercourse What changed: Another medication with the same name was added. Make sure you understand how and when to take each. Changed by: Michiel Cowboy   tadalafil 5 MG tablet Commonly known as: CIALIS Take 1 tablet (5 mg total) by mouth daily as needed for erectile dysfunction. What changed: You were already taking a medication with the same name, and this prescription was added. Make sure you understand how and when to take each. Changed by: Michiel Cowboy   tadalafil 20 MG tablet Commonly known as: CIALIS Take 1 tablet (20 mg total) by mouth daily as needed for erectile dysfunction. What changed: You were already taking a medication with the same name, and this prescription was added. Make sure you understand how and when to take each. Changed by: Michiel Cowboy    Vilazodone HCl 40 MG Tabs Commonly known as: VIIBRYD Take by mouth.        Allergies:  Allergies  Allergen Reactions   Atorvastatin Other (See Comments)   Penicillins Itching    Family History: No family history on file.  Social History:  reports that he has never smoked. He has never used smokeless tobacco. He reports current alcohol use. No history on file for drug use.  ROS: Pertinent ROS in HPI  Physical Exam: BP 123/73   Pulse 88   Ht 5\' 3"  (1.6 m)   Wt 155 lb (70.3 kg)   BMI 27.46 kg/m   Constitutional:  Well nourished. Alert and oriented, No acute distress. HEENT: [place] AT, moist mucus membranes.  Trachea midline Cardiovascular: No clubbing, cyanosis, or edema. Respiratory: Normal respiratory effort, no increased work of breathing. GU: No CVA tenderness.  No bladder fullness or masses.  Patient with circumcised phallus.  Urethral meatus is patent.  No penile discharge. No penile lesions or rashes. Scrotum without lesions, cysts, rashes and/or edema.  Testicles are located scrotally bilaterally. No masses are appreciated in the testicles. Left and right epididymis are normal. Rectal: Patient with  normal sphincter tone. Anus and perineum without scarring or rashes. No rectal masses are appreciated. Prostate is approximately 60 + grams, could not palpate the entire gland, no nodules are appreciated. Seminal vesicles could not be palpated.  Neurologic: Grossly intact, no focal deficits, moving all 4 extremities. Psychiatric: Normal mood and affect.  Laboratory Data: Lab Results  Component Value Date   TESTOSTERONE 418 01/17/2023   Component     Latest Ref Rng 01/17/2023  Prostate Specific Ag, Serum     0.0 - 4.0 ng/mL 0.6   Reflex Criteria Comment    Urinalysis Results for orders placed or performed in visit on 03/21/23  CULTURE, URINE COMPREHENSIVE   Specimen: Urine   UR  Result Value Ref Range   Urine Culture, Comprehensive Final report    Organism ID,  Bacteria Comment   Microscopic Examination   Urine  Result Value Ref Range   WBC, UA 0-5 0 - 5 /hpf   RBC, Urine None seen 0 - 2 /hpf   Epithelial Cells (non renal) 0-10 0 - 10 /hpf   Bacteria, UA None seen None seen/Few  Urinalysis, Complete  Result Value Ref Range   Specific Gravity, UA <1.005 (L) 1.005 - 1.030   pH, UA 6.0 5.0 - 7.5   Color, UA Yellow Yellow   Appearance Ur Clear Clear   Leukocytes,UA Negative Negative   Protein,UA Negative Negative/Trace  Glucose, UA Negative Negative   Ketones, UA Negative Negative   RBC, UA Negative Negative   Bilirubin, UA Negative Negative   Urobilinogen, Ur 0.2 0.2 - 1.0 mg/dL   Nitrite, UA Negative Negative   Microscopic Examination See below:   Bladder Scan (Post Void Residual) in office  Result Value Ref Range   Scan Result      I have reviewed the labs.   Pertinent Imaging:  03/21/23 13:21  Scan Result    Assessment & Plan:    1. BPH with LUTS -PSA stable  -DRE benign  -UA benign  -PVR < 300 cc  -most bothersome symptoms are hesitancy, weak urinary stream and dysuria -continue conservative management, avoiding bladder irritants and timed voiding's -Explained this is likely due to his BPH causing obstruction and since he is having issues with ejaculation it would be recommended to start tadalafil 5 mg daily  2. ED -At goal with 20 mg on demand dosing tadalafil -Advised him that he could continue to take the tadalafil 5 mg daily and still take the 20 mg of tadalafil on the days he wants to have intercourse  3. Ejaculatory disorder -In relation to the ejaculatory disorder and the reduction of semen volume, I had a frank discussion with the patient that as a man ages and the prostate is enlarged is in size, it can be expected that the amount of semen and a forceful ejaculation can be reduced.  This can occur even without medication as the prostate will enlarge as a man ages.     Return in about 1 month  (around 04/21/2023) for IPSS and PVR.  These notes generated with voice recognition software. I apologize for typographical errors.  Cloretta Ned  [facility] [address]  [address] [place], [place] [address] [phone]

## 2023-03-22 LAB — URINALYSIS, COMPLETE
Bilirubin, UA: NEGATIVE
Glucose, UA: NEGATIVE
Ketones, UA: NEGATIVE
Leukocytes,UA: NEGATIVE
Nitrite, UA: NEGATIVE
Protein,UA: NEGATIVE
RBC, UA: NEGATIVE
Specific Gravity, UA: <1.005 — ABNORMAL LOW (ref 1.005–1.030)
Urobilinogen, Ur: 0.2 mg/dL (ref 0.2–1.0)
pH, UA: 6 (ref 5.0–7.5)

## 2023-03-22 LAB — MICROSCOPIC EXAMINATION
Bacteria, UA: NONE SEEN
RBC, Urine: NONE SEEN /hpf (ref 0–2)

## 2023-03-24 LAB — CULTURE, URINE COMPREHENSIVE

## 2023-04-22 NOTE — Progress Notes (Unsigned)
04/23/2023 4:11 PM   Danny Brown 08/24/1955 [id number]  Referring provider: [facility] [address] [place],  [place] [address]  Urological history: 1. Erectile dysfunction -contributing factors of age, diabetes, HTN, HLD and BP meds.  -sildenafil - headaches  -tadalafil 20 mg, on-demand-dosing  2. BPH with LU TS -PSA (12/2022) - 0.6  Chief Complaint  Patient presents with   Benign Prostatic Hypertrophy   HPI: Danny Brown is a 67 y.o. male who presents today for urinary hesitancy, weak urinary stream and dysuria.  Previous records reviewed.  He was seen on March 21, 2023 by me for symptoms of urinary hesitancy, weak urinary stream, dysuria and delayed ejaculation.  He was started on tadalafil 5 mg daily.  We also discussed how prostate enlargement can contribute to ejaculatory disorders as well.  PVR demonstrated adequate bladder emptying.  The tadalafil 5 mg daily made no difference at all.  He still has urinary hesitancy, weak urinary stream and dysuria.  It is more bothersome at night.  Patient denies any modifying or aggravating factors.  Patient denies any recent UTI's, gross hematuria, dysuria or suprapubic/flank pain.  Patient denies any fevers, chills, nausea or vomiting.     IPSS     Row Name 04/23/23 1500         International Prostate Symptom Score   How often have you had the sensation of not emptying your bladder? More than half the time     How often have you had to urinate less than every two hours? About half the time     How often have you found you stopped and started again several times when you urinated? More than half the time     How often have you found it difficult to postpone urination? Less than 1 in 5 times     How often have you had a weak urinary stream? More than half the time     How often have you had to strain to start urination? Less than half the time     How many times did you typically get up at night to urinate?  3 Times     Total IPSS Score 21       Quality of Life due to urinary symptoms   If you were to spend the rest of your life with your urinary condition just the way it is now how would you feel about that? Unhappy               Score:  1-7 Mild 8-19 Moderate 20-35 Severe    PMH: Past Medical History:  Diagnosis Date   Diabetes mellitus without complication (HCC)    Hypochloremia     Surgical History: Past Surgical History:  Procedure Laterality Date   Uvula Removed      Home Medications:  Allergies as of 04/23/2023       Reactions   Atorvastatin Other (See Comments)   Penicillins Itching        Medication List        Accurate as of April 23, 2023  4:11 PM. If you have any questions, ask your nurse or doctor.          benzonatate 100 MG capsule Commonly known as: TESSALON Take 2 capsules (200 mg total) by mouth 3 (three) times daily as needed for cough.   fexofenadine-pseudoephedrine 180-240 MG 24 hr tablet Commonly known as: Allegra-D Allergy & Congestion Take 1 tablet by mouth daily.  metFORMIN 500 MG tablet Commonly known as: GLUCOPHAGE Take 500 mg by mouth daily.   tadalafil 20 MG tablet Commonly known as: CIALIS Take 1 tab 1 hour per to intercourse   tadalafil 5 MG tablet Commonly known as: CIALIS Take 1 tablet (5 mg total) by mouth daily as needed for erectile dysfunction.   tadalafil 20 MG tablet Commonly known as: CIALIS Take 1 tablet (20 mg total) by mouth daily as needed for erectile dysfunction.   Vilazodone HCl 40 MG Tabs Commonly known as: VIIBRYD Take by mouth.        Allergies:  Allergies  Allergen Reactions   Atorvastatin Other (See Comments)   Penicillins Itching    Family History: No family history on file.  Social History:  reports that he has never smoked. He has never used smokeless tobacco. He reports current alcohol use. No history on file for drug use.  ROS: Pertinent ROS in HPI  Physical  Exam: BP (!) 153/83   Pulse 98   Ht 5\' 3"  (1.6 m)   Wt 146 lb (66.2 kg)   BMI 25.86 kg/m   Constitutional:  Well nourished. Alert and oriented, No acute distress. HEENT: [place] AT, moist mucus membranes.  Trachea midline Cardiovascular: No clubbing, cyanosis, or edema. Respiratory: Normal respiratory effort, no increased work of breathing. Neurologic: Grossly intact, no focal deficits, moving all 4 extremities. Psychiatric: Normal mood and affect.   Laboratory Data: N/A   Pertinent Imaging:  04/23/23 15:38  Scan Result 130    Assessment & Plan:    1. BPH with severe LUTS -PSA stable  -PVR < 300 cc  -most bothersome symptoms are hesitancy, weak urinary stream and dysuria -continue conservative management, avoiding bladder irritants and timed voiding's -Tadalafil 5 mg daily was not effective in reducing his symptoms -he is not interested in any further medication, his father had prostate cancer and he wants to make sure everything is alright, I reassured him with a PSA of 0.6 it is not likely his symptoms are due to prostate cancer  -I recommended cystoscopy/TRUS for further evaluation for BOO -Explained that the cystoscopy is a safe and common diagnostic test performed by one of our physicians  in the office.  It consist of using a thin, lighted tube to look directly inside the bladder, prostate  and urethra to evaluate the anatomy.  The procedure is brief, typically taking about 5 minutes. -This will enable [place] to assess bladder health, diagnose and enlarged prostate, assess which BPH procedure may be most appropriate and rule out other bladder conditions (stricture disease, stones, cancer, etc.)  -Advised the patient that there are no restrictions to eating or drinking prior to the cystoscopy -They can continue to take all of their medications as prescribed -They can drive themselves to and from the appointment -I explained that during the procedure, the area around the urethra will  be cleansed thoroughly, topical anesthetic will be applied to numb your urethra, the thin tube is then gently inserted through the urethra into your bladder while fluid flows through the tube to the bladder to enable better visualization -I explained the procedure is usually not painful, however there may be some discomfort (pinching feeling), and they may feel an urge to urinate, coolness or fullness in the bladder and then the cystoscope is removed -After the cystoscopy, I advised them that they may experience urinary frequency, hematuria, dysuria which will resolve within 24 to 48 hours -Reviewed red flag signs (fever, bright red blood or  blood clots in the urine, abdominal pain or difficulty urinating) and to contact the office immediately or seek treatment in the ED if they should experience any of these -The physician will discuss the results of the cystoscopy at the time of the procedure   2. ED -At goal with 20 mg on demand dosing tadalafil -Advised him that he could continue to take the tadalafil 5 mg daily and still take the 20 mg of tadalafil on the days he wants to have intercourse  3. Ejaculatory issues -no improvement with tadalafil   Return for cysto/TRUS for BOO .  These notes generated with voice recognition software. I apologize for typographical errors.  Cloretta Ned  [facility] [address]  [address] [place], [place] [address] [phone]

## 2023-04-23 ENCOUNTER — Encounter: Payer: Self-pay | Admitting: Urology

## 2023-04-23 ENCOUNTER — Ambulatory Visit (INDEPENDENT_AMBULATORY_CARE_PROVIDER_SITE_OTHER): Payer: BC Managed Care – PPO | Admitting: Urology

## 2023-04-23 VITALS — BP 153/83 | HR 98 | Ht 63.0 in | Wt 146.0 lb

## 2023-04-23 DIAGNOSIS — N529 Male erectile dysfunction, unspecified: Secondary | ICD-10-CM | POA: Diagnosis not present

## 2023-04-23 DIAGNOSIS — N5319 Other ejaculatory dysfunction: Secondary | ICD-10-CM | POA: Diagnosis not present

## 2023-04-23 DIAGNOSIS — N401 Enlarged prostate with lower urinary tract symptoms: Secondary | ICD-10-CM

## 2023-04-23 LAB — BLADDER SCAN AMB NON-IMAGING: Scan Result: 130

## 2023-05-23 ENCOUNTER — Ambulatory Visit (INDEPENDENT_AMBULATORY_CARE_PROVIDER_SITE_OTHER): Payer: BC Managed Care – PPO | Admitting: Urology

## 2023-05-23 VITALS — BP 129/71 | HR 103 | Ht 63.0 in | Wt 148.0 lb

## 2023-05-23 DIAGNOSIS — N401 Enlarged prostate with lower urinary tract symptoms: Secondary | ICD-10-CM | POA: Diagnosis not present

## 2023-05-23 LAB — URINALYSIS, COMPLETE
Bilirubin, UA: NEGATIVE
Glucose, UA: NEGATIVE
Ketones, UA: NEGATIVE
Leukocytes,UA: NEGATIVE
Nitrite, UA: NEGATIVE
Specific Gravity, UA: 1.01 (ref 1.005–1.030)
Urobilinogen, Ur: 0.2 mg/dL (ref 0.2–1.0)
pH, UA: 6 (ref 5.0–7.5)

## 2023-05-23 LAB — MICROSCOPIC EXAMINATION

## 2023-05-23 MED ORDER — ALFUZOSIN HCL ER 10 MG PO TB24: 10.0000 mg | ORAL_TABLET | Freq: Every day | ORAL | 0 refills | Status: DC

## 2023-05-23 NOTE — Progress Notes (Signed)
   05/23/23  Chief Complaint  Patient presents with   Cysto     HPI: Refer to [facility] previous notes for details.     Blood pressure 129/71, pulse (!) 103, height 5\' 3"  (1.6 m), weight 148 lb (67.1 kg). NED. A&Ox3.   No respiratory distress   Abd soft, NT, ND Normal phallus with bilateral descended testicles    Cystoscopy Procedure Note  Patient identification was confirmed, informed consent was obtained, and patient was prepped using Betadine solution.  Lidocaine jelly was administered per urethral meatus.    Pre-Procedure: - Inspection reveals a normal caliber urethral meatus.  Procedure: The flexible cystoscope was introduced without difficulty - No urethral strictures/lesions are present. - Mild lateral lobe enlargement prostate  - Mild elevation bladder neck - Bilateral ureteral orifices identified - Bladder mucosa  reveals no ulcers, tumors, or lesions - No bladder stones - No trabeculation  Retroflexion shows no abnormalities   Post-Procedure: - Patient tolerated the procedure well   Prostate transrectal ultrasound sizing   Informed consent was obtained after discussing risks/benefits of the procedure.  A time out was performed to ensure correct patient identity.   Pre-Procedure: -Transrectal probe was placed without difficulty -Transrectal Ultrasound performed revealing a 33 gm prostate measuring 2.90 x 4.53 x 4.46 cm (length) -No significant hypoechoic or median lobe noted      Assessment/ Plan: Mild prostate enlargement Will add alfuzosin 10 mg daily due to less incidence of ejaculatory dysfunction.  Continue tadalafil 1 month follow-up with Carollee Herter for symptom reassessment Not an ideal candidate for UroLift.  Any outlet procedure would potentially result in retrograde ejaculation    Riki Altes, MD

## 2023-05-26 ENCOUNTER — Encounter: Payer: Self-pay | Admitting: Urology

## 2023-06-11 ENCOUNTER — Emergency Department: Payer: BC Managed Care – PPO

## 2023-06-11 ENCOUNTER — Emergency Department
Admission: EM | Admit: 2023-06-11 | Discharge: 2023-06-11 | Disposition: A | Discharge status: Home / Self Care | Payer: BC Managed Care – PPO | Attending: Emergency Medicine | Admitting: Emergency Medicine

## 2023-06-11 ENCOUNTER — Other Ambulatory Visit: Payer: Self-pay

## 2023-06-11 DIAGNOSIS — R410 Disorientation, unspecified: Secondary | ICD-10-CM | POA: Insufficient documentation

## 2023-06-11 DIAGNOSIS — R079 Chest pain, unspecified: Secondary | ICD-10-CM | POA: Insufficient documentation

## 2023-06-11 DIAGNOSIS — R0602 Shortness of breath: Secondary | ICD-10-CM | POA: Insufficient documentation

## 2023-06-11 LAB — BASIC METABOLIC PANEL
Anion gap: 8 (ref 5–15)
BUN: 17 mg/dL (ref 8–23)
CO2: 31 mmol/L (ref 22–32)
Calcium: 9.5 mg/dL (ref 8.9–10.3)
Chloride: 98 mmol/L (ref 98–111)
Creatinine, Ser: 1.08 mg/dL (ref 0.61–1.24)
GFR, Estimated: >60 mL/min (ref >60)
Glucose, Bld: 173 mg/dL — ABNORMAL HIGH (ref 70–99)
Potassium: 4.5 mmol/L (ref 3.5–5.1)
Sodium: 137 mmol/L (ref 135–145)

## 2023-06-11 LAB — CBG MONITORING, ED: Glucose-Capillary: 154 mg/dL — ABNORMAL HIGH (ref 70–99)

## 2023-06-11 LAB — CBC
HCT: 39.9 % (ref 39.0–52.0)
Hemoglobin: 13.8 g/dL (ref 13.0–17.0)
MCH: 31.6 pg (ref 26.0–34.0)
MCHC: 34.6 g/dL (ref 30.0–36.0)
MCV: 91.3 fL (ref 80.0–100.0)
Platelets: 247 10*3/uL (ref 150–400)
RBC: 4.37 MIL/uL (ref 4.22–5.81)
RDW: 12.2 % (ref 11.5–15.5)
WBC: 7.2 10*3/uL (ref 4.0–10.5)
nRBC: 0 % (ref 0.0–0.2)

## 2023-06-11 LAB — D-DIMER, QUANTITATIVE: D-Dimer, Quant: 0.96 ug{FEU}/mL — ABNORMAL HIGH (ref 0.00–0.50)

## 2023-06-11 LAB — TROPONIN I (HIGH SENSITIVITY)
Troponin I (High Sensitivity): 48 ng/L — ABNORMAL HIGH (ref <18)
Troponin I (High Sensitivity): 48 ng/L — ABNORMAL HIGH (ref <18)

## 2023-06-11 MED ORDER — IOHEXOL 350 MG/ML SOLN
75.0000 mL | Freq: Once | INTRAVENOUS | Status: AC | PRN
  Administered 2023-06-11: 75 mL via INTRAVENOUS

## 2023-06-11 NOTE — ED Provider Notes (Signed)
-----------------------------------------   6:22 PM on 06/11/2023 ----------------------------------------- Patient CTA of the chest is still pending.  I have reviewed the images I do not appreciate any large pulmonary embolus.  Patient states he is wishing to go home as he has been in the emergency department now over 8 hours.  Patient has a follow-up appointment tomorrow morning with his cardiologist.  Patient states he will follow-up for the CTA results tomorrow with his cardiologist or tonight on MyChart if they result.  Patient aware of the risks but wishes to go.  Will discharge from the emergency department   Minna Antis, MD 06/11/23 [phone]

## 2023-06-11 NOTE — ED Triage Notes (Addendum)
Pt here with cp since Sun. Pt states pain is centered and radiated to his left elbow. Pt states the pain was pressure in nature and was consistent. Pt denies NVD. Pt also having SOB. Pt also states that he has been confused since yesterday and today as well, pt denies fall. Pt stable in triage.

## 2023-06-11 NOTE — Discharge Instructions (Addendum)
Please seek medical attention for any high fevers, chest pain, shortness of breath, change in behavior, persistent vomiting, bloody stool or any other new or concerning symptoms.  Please follow-up with your cardiologist tomorrow morning as scheduled.  Please let them know that you had a CT scan performed and you do not yet have the results.

## 2023-06-11 NOTE — ED Provider Notes (Signed)
[facility] Provider Note    Event Date/Time   First MD Initiated Contact with Patient 06/11/23 1235     (approximate)   History   Chest Pain   HPI  Danny Brown is a 67 y.o. male presents to the emergency department today because of concerns for an episode of chest pressure that occurred 2 days ago.  He had been power washing the bed of his truck when he noticed pain to his left elbow.  He stopped and he walked into the house.  He states that during that walk he started developing significant shortness of breath and the left sided chest pressure.  He did sit down and the symptoms improved over a few minutes.  Since then he has felt fatigued.  Wife is also noticed some increased confusion.  Patient denies similar symptoms in the past.  Denies any recent travel.  No leg swelling or pain.     Physical Exam   Triage Vital Signs: ED Triage Vitals  Encounter Vitals Group     BP 06/11/23 1030 132/86     Systolic BP Percentile --      Diastolic BP Percentile --      Pulse Rate 06/11/23 1030 (!) 105     Resp 06/11/23 1030 17     Temp --      Temp src --      SpO2 06/11/23 1030 99 %     Weight 06/11/23 1028 147 lb 14.9 oz (67.1 kg)     Height 06/11/23 1028 5\' 3"  (1.6 m)     Head Circumference --      Peak Flow --      Pain Score 06/11/23 1028 5     Pain Loc --      Pain Education --      Exclude from Growth Chart --     Most recent vital signs: Vitals:   06/11/23 1030  BP: 132/86  Pulse: (!) 105  Resp: 17  SpO2: 99%   General: Awake, alert, oriented. CV:  Good peripheral perfusion. Tachycardia. Resp:  Normal effort. Lungs clear. Abd:  No distention.    ED Results / Procedures / Treatments   Labs (all labs ordered are listed, but only abnormal results are displayed) Labs Reviewed  BASIC METABOLIC PANEL - Abnormal; Notable for the following components:      Result Value   Glucose, Bld 173 (*)    All other components within normal limits   CBG MONITORING, ED - Abnormal; Notable for the following components:   Glucose-Capillary 154 (*)    All other components within normal limits  TROPONIN I (HIGH SENSITIVITY) - Abnormal; Notable for the following components:   Troponin I (High Sensitivity) 48 (*)    All other components within normal limits  CBC  TROPONIN I (HIGH SENSITIVITY)     EKG  I, Phineas Semen, attending physician, personally viewed and interpreted this EKG  EKG Time: 1028 Rate: 107 Rhythm: sinus tachycardia Axis: normal Intervals: qtc 429 QRS: narrow, q waves v1, III ST changes: no st elevation Impression: abnormal ekg   RADIOLOGY I independently interpreted and visualized the CXR. My interpretation: No pneumonia Radiology interpretation: ***    PROCEDURES:  Critical Care performed: Yes  CRITICAL CARE Performed by: Phineas Semen   Total critical care time: *** minutes  Critical care time was exclusive of separately billable procedures and treating other patients.  Critical care was necessary to treat or prevent imminent or life-threatening  deterioration.  Critical care was time spent personally by me on the following activities: development of treatment plan with patient and/or surrogate as well as nursing, discussions with consultants, evaluation of patient's response to treatment, examination of patient, obtaining history from patient or surrogate, ordering and performing treatments and interventions, ordering and review of laboratory studies, ordering and review of radiographic studies, pulse oximetry and re-evaluation of patient's condition.   Procedures    MEDICATIONS ORDERED IN ED: Medications - No data to display   IMPRESSION / MDM / ASSESSMENT AND PLAN / ED COURSE  I reviewed the triage vital signs and the nursing notes.                              Differential diagnosis includes, but is not limited to, ACS, PE, pneumonia, pneumothorax  Patient's presentation is most  consistent with acute presentation with potential threat to life or bodily function.   The patient is on the cardiac monitor to evaluate for evidence of arrhythmia and/or significant heart rate changes.  Patient presented to the emergency department today because of concerns for episode of chest pain that occurred 2 days ago.  The time my exam he denies any further pain but states he has fatigue.  No concerning auscultatory findings.  EKG without ST elevation or arrhythmia.  Blood work does show a slightly elevated initial troponin.  Will obtain second troponin as well as D-dimer.  ***      FINAL CLINICAL IMPRESSION(S) / ED DIAGNOSES   Final diagnoses:  None     Rx / DC Orders   ED Discharge Orders     None        Note:  This document was prepared using Dragon voice recognition software and may include unintentional dictation errors.

## 2023-06-12 ENCOUNTER — Other Ambulatory Visit: Payer: Self-pay | Admitting: Internal Medicine

## 2023-06-12 ENCOUNTER — Inpatient Hospital Stay
Admission: RE | Admit: 2023-06-12 | Discharge: 2023-06-12 | Disposition: A | Discharge status: Home / Self Care | Payer: Medicare Other | Source: Ambulatory Visit | Attending: Internal Medicine | Admitting: Internal Medicine

## 2023-06-12 DIAGNOSIS — I10 Essential (primary) hypertension: Secondary | ICD-10-CM

## 2023-06-12 DIAGNOSIS — R011 Cardiac murmur, unspecified: Secondary | ICD-10-CM

## 2023-06-12 DIAGNOSIS — R002 Palpitations: Secondary | ICD-10-CM

## 2023-06-12 DIAGNOSIS — R079 Chest pain, unspecified: Secondary | ICD-10-CM

## 2023-06-12 DIAGNOSIS — R000 Tachycardia, unspecified: Secondary | ICD-10-CM

## 2023-06-12 DIAGNOSIS — R0602 Shortness of breath: Secondary | ICD-10-CM

## 2023-06-12 DIAGNOSIS — R9439 Abnormal result of other cardiovascular function study: Secondary | ICD-10-CM

## 2023-06-12 DIAGNOSIS — R42 Dizziness and giddiness: Secondary | ICD-10-CM

## 2023-06-12 DIAGNOSIS — G4733 Obstructive sleep apnea (adult) (pediatric): Secondary | ICD-10-CM

## 2023-06-12 DIAGNOSIS — E785 Hyperlipidemia, unspecified: Secondary | ICD-10-CM

## 2023-06-12 DIAGNOSIS — E7800 Pure hypercholesterolemia, unspecified: Secondary | ICD-10-CM

## 2023-06-12 DIAGNOSIS — R0789 Other chest pain: Secondary | ICD-10-CM

## 2023-06-12 DIAGNOSIS — E119 Type 2 diabetes mellitus without complications: Secondary | ICD-10-CM

## 2023-06-12 MED ORDER — METOPROLOL TARTRATE 100 MG PO TABS: 100.0000 mg | ORAL_TABLET | Freq: Once | ORAL | Status: DC

## 2023-06-12 MED ORDER — IVABRADINE HCL 7.5 MG PO TABS: 15.0000 mg | ORAL_TABLET | Freq: Once | ORAL | Status: DC

## 2023-06-12 MED ORDER — IVABRADINE HCL 7.5 MG PO TABS: 15.0000 mg | ORAL_TABLET | Freq: Two times a day (BID) | ORAL | Status: DC

## 2023-06-13 ENCOUNTER — Telehealth (HOSPITAL_COMMUNITY): Payer: Self-pay | Admitting: Emergency Medicine

## 2023-06-13 DIAGNOSIS — R079 Chest pain, unspecified: Secondary | ICD-10-CM

## 2023-06-13 MED ORDER — METOPROLOL TARTRATE 100 MG PO TABS: 100.0000 mg | ORAL_TABLET | Freq: Once | ORAL | 0 refills | Status: DC

## 2023-06-13 MED ORDER — IVABRADINE HCL 5 MG PO TABS: 15.0000 mg | ORAL_TABLET | Freq: Once | ORAL | 0 refills | Status: AC

## 2023-06-13 NOTE — Telephone Encounter (Signed)
Reaching out to patient to offer assistance regarding upcoming cardiac imaging study; pt verbalizes understanding of appt date/time, parking situation and where to check in, pre-test NPO status and medications ordered, and verified current allergies; name and call back number provided for further questions should they arise Marchia Bond RN Navigator Cardiac Imaging [facility] [phone] office [phone] cell  '100mg'$  metoprolol + '15mg'$  ivabradine

## 2023-06-17 ENCOUNTER — Other Ambulatory Visit: Payer: Self-pay | Admitting: Internal Medicine

## 2023-06-17 ENCOUNTER — Ambulatory Visit
Admission: RE | Admit: 2023-06-17 | Discharge: 2023-06-17 | Disposition: A | Discharge status: Home / Self Care | Payer: BC Managed Care – PPO | Source: Ambulatory Visit | Attending: Internal Medicine | Admitting: Internal Medicine

## 2023-06-17 DIAGNOSIS — I2510 Atherosclerotic heart disease of native coronary artery without angina pectoris: Secondary | ICD-10-CM | POA: Insufficient documentation

## 2023-06-17 DIAGNOSIS — R9439 Abnormal result of other cardiovascular function study: Secondary | ICD-10-CM | POA: Insufficient documentation

## 2023-06-17 DIAGNOSIS — R931 Abnormal findings on diagnostic imaging of heart and coronary circulation: Secondary | ICD-10-CM | POA: Diagnosis not present

## 2023-06-17 DIAGNOSIS — I10 Essential (primary) hypertension: Secondary | ICD-10-CM | POA: Insufficient documentation

## 2023-06-17 DIAGNOSIS — R079 Chest pain, unspecified: Secondary | ICD-10-CM | POA: Diagnosis present

## 2023-06-17 DIAGNOSIS — R0602 Shortness of breath: Secondary | ICD-10-CM

## 2023-06-17 MED ORDER — DILTIAZEM HCL 25 MG/5ML IV SOLN
10.0000 mg | INTRAVENOUS | Status: DC | PRN
  Filled 2023-06-17: qty 5

## 2023-06-17 MED ORDER — IOHEXOL 350 MG/ML SOLN
80.0000 mL | Freq: Once | INTRAVENOUS | Status: AC | PRN
  Administered 2023-06-17: 80 mL via INTRAVENOUS

## 2023-06-17 MED ORDER — NITROGLYCERIN 0.4 MG SL SUBL
0.8000 mg | SUBLINGUAL_TABLET | Freq: Once | SUBLINGUAL | Status: AC
  Administered 2023-06-17: 0.8 mg via SUBLINGUAL
  Filled 2023-06-17: qty 25

## 2023-06-17 MED ORDER — METOPROLOL TARTRATE 5 MG/5ML IV SOLN
10.0000 mg | Freq: Once | INTRAVENOUS | Status: DC | PRN
  Filled 2023-06-17: qty 10

## 2023-06-17 NOTE — Progress Notes (Signed)
Patient tolerated procedure well. Ambulate w/o difficulty. Denies any lightheadedness or being dizzy. Pt denies any pain at this time. Sitting in chair. Pt is encouraged to drink additional water throughout the day and reason explained to patient. Patient verbalized understanding and all questions answered. ABC intact. No further needs at this time. Discharge from procedure area w/o issues. 

## 2023-06-19 ENCOUNTER — Other Ambulatory Visit: Payer: Self-pay

## 2023-06-19 ENCOUNTER — Encounter: Payer: Self-pay | Admitting: Internal Medicine

## 2023-06-19 ENCOUNTER — Encounter
Admission: RE | Disposition: A | Discharge status: Home / Self Care | Payer: Self-pay | Source: Ambulatory Visit | Attending: Internal Medicine

## 2023-06-19 ENCOUNTER — Observation Stay
Admission: RE | Admit: 2023-06-19 | Discharge: 2023-06-19 | Disposition: A | Discharge status: Home / Self Care | Payer: BC Managed Care – PPO | Source: Ambulatory Visit | Attending: Internal Medicine | Admitting: Internal Medicine

## 2023-06-19 DIAGNOSIS — I10 Essential (primary) hypertension: Secondary | ICD-10-CM | POA: Diagnosis not present

## 2023-06-19 DIAGNOSIS — Z7984 Long term (current) use of oral hypoglycemic drugs: Secondary | ICD-10-CM | POA: Diagnosis not present

## 2023-06-19 DIAGNOSIS — Z955 Presence of coronary angioplasty implant and graft: Principal | ICD-10-CM

## 2023-06-19 DIAGNOSIS — Z87891 Personal history of nicotine dependence: Secondary | ICD-10-CM | POA: Diagnosis not present

## 2023-06-19 DIAGNOSIS — Z79899 Other long term (current) drug therapy: Secondary | ICD-10-CM | POA: Insufficient documentation

## 2023-06-19 DIAGNOSIS — I25110 Atherosclerotic heart disease of native coronary artery with unstable angina pectoris: Secondary | ICD-10-CM | POA: Diagnosis present

## 2023-06-19 DIAGNOSIS — Z01810 Encounter for preprocedural cardiovascular examination: Secondary | ICD-10-CM | POA: Diagnosis not present

## 2023-06-19 DIAGNOSIS — E119 Type 2 diabetes mellitus without complications: Secondary | ICD-10-CM | POA: Diagnosis not present

## 2023-06-19 DIAGNOSIS — R931 Abnormal findings on diagnostic imaging of heart and coronary circulation: Secondary | ICD-10-CM

## 2023-06-19 HISTORY — DX: Dyspnea, unspecified: R06.00

## 2023-06-19 HISTORY — DX: Gilbert syndrome: E80.4

## 2023-06-19 HISTORY — DX: Pure hypercholesterolemia, unspecified: E78.00

## 2023-06-19 HISTORY — DX: Sleep apnea, unspecified: G47.30

## 2023-06-19 HISTORY — DX: Hyperlipidemia, unspecified: E78.5

## 2023-06-19 HISTORY — DX: Essential (primary) hypertension: I10

## 2023-06-19 HISTORY — PX: LEFT HEART CATH AND CORONARY ANGIOGRAPHY: CATH118249

## 2023-06-19 HISTORY — PX: CORONARY STENT INTERVENTION: CATH118234

## 2023-06-19 HISTORY — DX: Cardiac murmur, unspecified: R01.1

## 2023-06-19 HISTORY — DX: Gastro-esophageal reflux disease without esophagitis: K21.9

## 2023-06-19 LAB — POCT ACTIVATED CLOTTING TIME
Activated Clotting Time: 302 s
Activated Clotting Time: 366 s

## 2023-06-19 LAB — GLUCOSE, CAPILLARY: Glucose-Capillary: 135 mg/dL — ABNORMAL HIGH (ref 70–99)

## 2023-06-19 LAB — CARDIAC CATHETERIZATION: Cath EF Quantitative: 60 %

## 2023-06-19 SURGERY — LEFT HEART CATH AND CORONARY ANGIOGRAPHY
Anesthesia: Moderate Sedation

## 2023-06-19 MED ORDER — HEPARIN (PORCINE) IN NACL 1000-0.9 UT/500ML-% IV SOLN
INTRAVENOUS | Status: AC
  Filled 2023-06-19: qty 1000

## 2023-06-19 MED ORDER — TICAGRELOR 90 MG PO TABS
ORAL_TABLET | ORAL | Status: DC | PRN
  Administered 2023-06-19: 180 mg via ORAL

## 2023-06-19 MED ORDER — MIDAZOLAM HCL 2 MG/2ML IJ SOLN
INTRAMUSCULAR | Status: DC | PRN
  Administered 2023-06-19: 1 mg via INTRAVENOUS

## 2023-06-19 MED ORDER — SODIUM CHLORIDE 0.9 % IV SOLN: 250.0000 mL | INTRAVENOUS | Status: DC | PRN

## 2023-06-19 MED ORDER — FENTANYL CITRATE (PF) 100 MCG/2ML IJ SOLN
INTRAMUSCULAR | Status: AC
  Filled 2023-06-19: qty 2

## 2023-06-19 MED ORDER — LABETALOL HCL 5 MG/ML IV SOLN: 10.0000 mg | INTRAVENOUS | Status: DC | PRN

## 2023-06-19 MED ORDER — ASPIRIN 81 MG PO CHEW
CHEWABLE_TABLET | ORAL | Status: AC
  Filled 2023-06-19: qty 1

## 2023-06-19 MED ORDER — HEPARIN (PORCINE) IN NACL 2000-0.9 UNIT/L-% IV SOLN
INTRAVENOUS | Status: DC | PRN
  Administered 2023-06-19: 1000 mL

## 2023-06-19 MED ORDER — HEPARIN SODIUM (PORCINE) 1000 UNIT/ML IJ SOLN
INTRAMUSCULAR | Status: AC
  Filled 2023-06-19: qty 10

## 2023-06-19 MED ORDER — ASPIRIN 81 MG PO CHEW
CHEWABLE_TABLET | ORAL | Status: AC
  Filled 2023-06-19: qty 3

## 2023-06-19 MED ORDER — IOHEXOL 300 MG/ML  SOLN
INTRAMUSCULAR | Status: DC | PRN
  Administered 2023-06-19: 303 mL

## 2023-06-19 MED ORDER — VERAPAMIL HCL 2.5 MG/ML IV SOLN
INTRAVENOUS | Status: DC | PRN
  Administered 2023-06-19 (×2): 2.5 mg via INTRA_ARTERIAL

## 2023-06-19 MED ORDER — TICAGRELOR 90 MG PO TABS: 90.0000 mg | ORAL_TABLET | Freq: Two times a day (BID) | ORAL | Status: DC

## 2023-06-19 MED ORDER — SODIUM CHLORIDE 0.9 % WEIGHT BASED INFUSION: 1.0000 mL/kg/h | INTRAVENOUS | Status: DC

## 2023-06-19 MED ORDER — ONDANSETRON HCL 4 MG/2ML IJ SOLN: 4.0000 mg | Freq: Four times a day (QID) | INTRAMUSCULAR | Status: DC | PRN

## 2023-06-19 MED ORDER — SODIUM CHLORIDE 0.9 % WEIGHT BASED INFUSION
3.0000 mL/kg/h | INTRAVENOUS | Status: AC
  Administered 2023-06-19: 3 mL/kg/h via INTRAVENOUS

## 2023-06-19 MED ORDER — TICAGRELOR 90 MG PO TABS
ORAL_TABLET | ORAL | Status: AC
  Filled 2023-06-19: qty 2

## 2023-06-19 MED ORDER — MIDAZOLAM HCL 2 MG/2ML IJ SOLN
INTRAMUSCULAR | Status: AC
  Filled 2023-06-19: qty 2

## 2023-06-19 MED ORDER — SODIUM CHLORIDE 0.9% FLUSH: 3.0000 mL | Freq: Two times a day (BID) | INTRAVENOUS | Status: DC

## 2023-06-19 MED ORDER — ACETAMINOPHEN 325 MG PO TABS: 650.0000 mg | ORAL_TABLET | ORAL | Status: DC | PRN

## 2023-06-19 MED ORDER — ASPIRIN 81 MG PO CHEW
CHEWABLE_TABLET | ORAL | Status: DC | PRN
  Administered 2023-06-19: 243 mg via ORAL

## 2023-06-19 MED ORDER — ASPIRIN 81 MG PO CHEW: 81.0000 mg | CHEWABLE_TABLET | Freq: Every day | ORAL | Status: DC

## 2023-06-19 MED ORDER — LIDOCAINE HCL (PF) 1 % IJ SOLN
INTRAMUSCULAR | Status: DC | PRN
  Administered 2023-06-19: 2 mL

## 2023-06-19 MED ORDER — SODIUM CHLORIDE 0.9% FLUSH: 3.0000 mL | INTRAVENOUS | Status: DC | PRN

## 2023-06-19 MED ORDER — SODIUM CHLORIDE 0.9 % WEIGHT BASED INFUSION
1.0000 mL/kg/h | INTRAVENOUS | Status: DC
  Administered 2023-06-19: 1 mL/kg/h via INTRAVENOUS

## 2023-06-19 MED ORDER — FENTANYL CITRATE (PF) 100 MCG/2ML IJ SOLN
INTRAMUSCULAR | Status: DC | PRN
  Administered 2023-06-19: 25 ug via INTRAVENOUS

## 2023-06-19 MED ORDER — VERAPAMIL HCL 2.5 MG/ML IV SOLN
INTRAVENOUS | Status: AC
  Filled 2023-06-19: qty 2

## 2023-06-19 MED ORDER — TICAGRELOR 90 MG PO TABS
90.0000 mg | ORAL_TABLET | Freq: Two times a day (BID) | ORAL | 0 refills | Status: DC
  Filled 2023-06-19: qty 60, 30d supply, fill #0

## 2023-06-19 MED ORDER — HEPARIN SODIUM (PORCINE) 1000 UNIT/ML IJ SOLN
INTRAMUSCULAR | Status: DC | PRN
  Administered 2023-06-19: 2000 [IU] via INTRAVENOUS
  Administered 2023-06-19: 6000 [IU] via INTRAVENOUS
  Administered 2023-06-19: 3500 [IU] via INTRAVENOUS

## 2023-06-19 MED ORDER — ASPIRIN 81 MG PO CHEW
81.0000 mg | CHEWABLE_TABLET | ORAL | Status: AC
  Administered 2023-06-19: 81 mg via ORAL

## 2023-06-19 SURGICAL SUPPLY — 22 items
BALLN TREK RX 2.5X12 (BALLOONS) ×2
BALLN ~~LOC~~ TREK NEO RX 2.5X15 (BALLOONS) ×2
BALLOON TREK RX 2.5X12 (BALLOONS) IMPLANT
BALLOON ~~LOC~~ TREK NEO RX 2.5X15 (BALLOONS) IMPLANT
CATH 5FR JL3.5 JR4 ANG PIG MP (CATHETERS) IMPLANT
CATH INFINITI 5 FR 3DRC (CATHETERS) IMPLANT
CATH INFINITI 5 FR MPA2 (CATHETERS) IMPLANT
CATH VISTA GUIDE 6FR XB3 (CATHETERS) IMPLANT
DEVICE RAD TR BAND REGULAR (VASCULAR PRODUCTS) IMPLANT
DRAPE BRACHIAL (DRAPES) IMPLANT
GLIDESHEATH SLEND SS 6F .021 (SHEATH) IMPLANT
GUIDEWIRE INQWIRE 1.5J.035X260 (WIRE) IMPLANT
INQWIRE 1.5J .035X260CM (WIRE) ×2
KIT ENCORE 26 ADVANTAGE (KITS) IMPLANT
KIT SYRINGE INJ CVI SPIKEX1 (MISCELLANEOUS) IMPLANT
PACK CARDIAC CATH (CUSTOM PROCEDURE TRAY) ×2 IMPLANT
PROTECTION STATION PRESSURIZED (MISCELLANEOUS) ×2
SET ATX-X65L (MISCELLANEOUS) IMPLANT
STATION PROTECTION PRESSURIZED (MISCELLANEOUS) IMPLANT
STENT ONYX FRONTIER 2.5X26 (Permanent Stent) IMPLANT
TUBING CIL FLEX 10 FLL-RA (TUBING) IMPLANT
WIRE G HI TQ BMW 190 (WIRE) IMPLANT

## 2023-06-19 NOTE — Discharge Instructions (Signed)
Radial Site Care Refer to this sheet in the next few weeks. These instructions provide you with information about caring for yourself after your procedure. Your health care provider may also give you more specific instructions. Your treatment has been planned according to current medical practices, but problems sometimes occur. Call your health care provider if you have any problems or questions after your procedure. What can I expect after the procedure? After your procedure, it is typical to have the following: Bruising at the radial site that usually fades within 1-2 weeks. Blood collecting in the tissue (hematoma) that may be painful to the touch. It should usually decrease in size and tenderness within 1-2 weeks.  Follow these instructions at home: Take medicines only as directed by your health care provider. If you are on a medication called Metformin please do not take for 48 hours after your procedure. Over the next 48hrs please increase your fluid intake of water and non caffeine beverages to flush the contrast dye out of your system.  You may shower 24 hours after the procedure  Leave your bandage on and gently wash the site with plain soap and water. Pat the area dry with a clean towel. Do not rub the site, because this may cause bleeding.  Remove your dressing 48hrs after your procedure and leave open to air.  Do not submerge your site in water for 7 days. This includes swimming and washing dishes.  Check your insertion site every day for redness, swelling, or drainage. Do not apply powder or lotion to the site. Do not flex or bend the affected arm for 24 hours or as directed by your health care provider. Do not push or pull heavy objects with the affected arm for 24 hours or as directed by your health care provider. Do not lift over 10 lb (4.5 kg) for 5 days after your procedure or as directed by your health care provider. Ask your health care provider when it is okay to: Return to  work or school. Resume usual physical activities or sports. Resume sexual activity. Do not drive home if you are discharged the same day as the procedure. Have someone else drive you. You may drive 48 hours after the procedure Do not operate machinery or power tools for 24 hours after the procedure. If your procedure was done as an outpatient procedure, which means that you went home the same day as your procedure, a responsible adult should be with you for the first 24 hours after you arrive home. Keep all follow-up visits as directed by your health care provider. This is important. Contact a health care provider if: You have a fever. You have chills. You have increased bleeding from the radial site. Hold pressure on the site. Get help right away if: You have unusual pain at the radial site. You have redness, warmth, or swelling at the radial site. You have drainage (other than a small amount of blood on the dressing) from the radial site. The radial site is bleeding, and the bleeding does not stop after 15 minutes of holding steady pressure on the site. Your arm or hand becomes pale, cool, tingly, or numb. This information is not intended to replace advice given to you by your health care provider. Make sure you discuss any questions you have with your health care provider. Document Released: 08/11/2010 Document Revised: 12/15/2015 Document Reviewed: 01/25/2014 Elsevier Interactive Patient Education  2018 [employer].

## 2023-06-24 ENCOUNTER — Encounter: Payer: Self-pay | Admitting: Internal Medicine

## 2023-06-24 NOTE — Progress Notes (Unsigned)
06/25/2023 8:53 AM   Danny Brown 1955-12-23 [id number]  Referring provider: [facility] [address] [place],  [place] [address]  Urological history: 1. Erectile dysfunction -contributing factors of age, diabetes, HTN, HLD and BP meds.  -sildenafil - headaches  -tadalafil 20 mg, on-demand-dosing  2. BPH with LU TS -PSA (12/2022) - 0.6 -cysto (04/2023) -mild lateral lobe enlargement and mild elevation of the bladder neck -TRUS (04/2023) - 33 gram prostate   No chief complaint on file.  HPI: Danny Brown is a 67 y.o. male who presents today for follow up after a trial alfuzosin 10 mg daily.  Previous records reviewed.    He underwent cystoscopy and TRUS for further evaluation of his urinary hesitancy, weak urinary stream and dysuria.  No abnormal findings on cystoscopy and TRUS demonstrated a small prostate.  He was deemed a poor candidate for any bladder outlet procedure as he had no demonstrable BPH with obstruction.  He was to continue tadalafil 5 mg daily with the addition of alfuzosin 10 mg daily.       Score:  1-7 Mild 8-19 Moderate 20-35 Severe    PMH: Past Medical History:  Diagnosis Date   Diabetes mellitus without complication (HCC)    Dyspnea    GERD (gastroesophageal reflux disease)    Gilbert syndrome    Heart murmur    Hypercholesterolemia    Hyperlipidemia    Hypertension    Hypochloremia    Sleep apnea     Surgical History: Past Surgical History:  Procedure Laterality Date   CORONARY STENT INTERVENTION N/A 06/19/2023   Procedure: CORONARY STENT INTERVENTION;  Surgeon: Alwyn Pea, MD;  Location: [facility] IN[facility]IVE CV [facility];  Service: Cardiovascular;  Laterality: N/A;   LEFT HEART CATH AND CORONARY ANGIOGRAPHY Left 06/19/2023   Procedure: LEFT HEART CATH AND CORONARY ANGIOGRAPHY;  Surgeon: Alwyn Pea, MD;  Location: [facility] INV[facility] CV [facility];  Service: Cardiovascular;  Laterality: Left;   PALATE / UVULA BIOPSY /  EXCISION  07/14/2015   uvula removed   Uvula Removed      Home Medications:  Allergies as of 06/25/2023       Reactions   Atorvastatin Other (See Comments)   Joint pain   Penicillins Itching   Rosuvastatin Other (See Comments)   Joint pain        Medication List        Accurate as of June 24, 2023  8:53 AM. If you have any questions, ask your nurse or doctor.          acetaminophen 500 MG tablet Commonly known as: TYLENOL Take 1,000 mg by mouth every 6 (six) hours as needed for moderate pain (pain score 4-6).   alfuzosin 10 MG 24 hr tablet Commonly known as: UROXATRAL Take 1 tablet (10 mg total) by mouth daily with breakfast.   aspirin EC 81 MG tablet Take 81 mg by mouth daily. Swallow whole.   atomoxetine 40 MG capsule Commonly known as: STRATTERA Take 40 mg by mouth every morning.   Brilinta 90 MG Tabs tablet Generic drug: ticagrelor Take 1 tablet (90 mg total) by mouth 2 (two) times daily.   enalapril 2.5 MG tablet Commonly known as: VASOTEC Take 2.5 mg by mouth daily.   ezetimibe 10 MG tablet Commonly known as: ZETIA Take 10 mg by mouth daily.   L-METHYLFOLATE PO Take 1 tablet by mouth daily.   metFORMIN 750 MG 24 hr tablet Commonly known as: GLUCOPHAGE-XR Take 750  mg by mouth 2 (two) times daily with a meal.   metoprolol succinate 25 MG 24 hr tablet Commonly known as: TOPROL-XL Take 25 mg by mouth daily.   pravastatin 40 MG tablet Commonly known as: PRAVACHOL Take 40 mg by mouth at bedtime.   tadalafil 5 MG tablet Commonly known as: CIALIS Take 1 tablet (5 mg total) by mouth daily as needed for erectile dysfunction. What changed: when to take this   tadalafil 20 MG tablet Commonly known as: CIALIS Take 1 tablet (20 mg total) by mouth daily as needed for erectile dysfunction. What changed: Another medication with the same name was changed. Make sure you understand how and when to take each.   Vilazodone HCl 40 MG Tabs Commonly  known as: VIIBRYD Take 40 mg by mouth daily.        Allergies:  Allergies  Allergen Reactions   Atorvastatin Other (See Comments)    Joint pain   Penicillins Itching   Rosuvastatin Other (See Comments)    Joint pain    Family History: No family history on file.  Social History:  reports that he has never smoked. He has never used smokeless tobacco. He reports current alcohol use. He reports that he does not use drugs.  ROS: Pertinent ROS in HPI  Physical Exam: There were no vitals taken for this visit.  Constitutional:  Well nourished. Alert and oriented, No acute distress. HEENT: [place] AT, moist mucus membranes.  Trachea midline, no masses. Cardiovascular: No clubbing, cyanosis, or edema. Respiratory: Normal respiratory effort, no increased work of breathing. GI: Abdomen is soft, non tender, non distended, no abdominal masses. Liver and spleen not palpable.  No hernias appreciated.  Stool sample for occult testing is not indicated.   GU: No CVA tenderness.  No bladder fullness or masses.  Patient with circumcised/uncircumcised phallus. ***Foreskin easily retracted***  Urethral meatus is patent.  No penile discharge. No penile lesions or rashes. Scrotum without lesions, cysts, rashes and/or edema.  Testicles are located scrotally bilaterally. No masses are appreciated in the testicles. Left and right epididymis are normal. Rectal: Patient with  normal sphincter tone. Anus and perineum without scarring or rashes. No rectal masses are appreciated. Prostate is approximately *** grams, *** nodules are appreciated. Seminal vesicles are normal. Skin: No rashes, bruises or suspicious lesions. Lymph: No cervical or inguinal adenopathy. Neurologic: Grossly intact, no focal deficits, moving all 4 extremities. Psychiatric: Normal mood and affect.   Laboratory Data: CBC    Component Value Date/Time   WBC 7.2 06/11/2023 1031   RBC 4.37 06/11/2023 1031   HGB 13.8 06/11/2023 1031   HCT  39.9 06/11/2023 1031   PLT 247 06/11/2023 1031   MCV 91.3 06/11/2023 1031   MCH 31.6 06/11/2023 1031   MCHC 34.6 06/11/2023 1031   RDW 12.2 06/11/2023 1031    BMET    Component Value Date/Time   NA 137 06/11/2023 1031   K 4.5 06/11/2023 1031   CL 98 06/11/2023 1031   CO2 31 06/11/2023 1031   GLUCOSE 173 (H) 06/11/2023 1031   BUN 17 06/11/2023 1031   CREATININE 1.08 06/11/2023 1031   CALCIUM 9.5 06/11/2023 1031   GFRNONAA >60 06/11/2023 1031    Pertinent Imaging: ***   Assessment & Plan:    1. BPH with severe LUTS -PSA stable  -PVR < 300 cc  -most bothersome symptoms are hesitancy, weak urinary stream and dysuria -continue conservative management, avoiding bladder irritants and timed voiding's -Tadalafil 5 mg daily  was not effective in reducing his symptoms -he is not interested in any further medication, his father had prostate cancer and he wants to make sure everything is alright, I reassured him with a PSA of 0.6 it is not likely his symptoms are due to prostate cancer  -cysto/trus ruled out BOO -he was recently seen in the ED for cardiac issues, recommend discontinuing alfuzosin and tadalafil until cardiac work up is complete   2. ED -recommend holding tadalafil until cardiac work up is complete  3. Ejaculatory issues -no improvement with tadalafil   No follow-ups on file.  These notes generated with voice recognition software. I apologize for typographical errors.  Cloretta Ned  [facility] [address]  [address] [place], [place] [address] [phone]

## 2023-06-25 ENCOUNTER — Encounter: Payer: Self-pay | Admitting: Urology

## 2023-06-25 ENCOUNTER — Ambulatory Visit (INDEPENDENT_AMBULATORY_CARE_PROVIDER_SITE_OTHER): Payer: BC Managed Care – PPO | Admitting: Urology

## 2023-06-25 VITALS — BP 147/84 | HR 85 | Ht 63.0 in | Wt 150.0 lb

## 2023-06-25 DIAGNOSIS — N529 Male erectile dysfunction, unspecified: Secondary | ICD-10-CM

## 2023-06-25 DIAGNOSIS — N5319 Other ejaculatory dysfunction: Secondary | ICD-10-CM | POA: Diagnosis not present

## 2023-06-25 DIAGNOSIS — N401 Enlarged prostate with lower urinary tract symptoms: Secondary | ICD-10-CM | POA: Diagnosis not present

## 2023-06-25 LAB — BLADDER SCAN AMB NON-IMAGING: Scan Result: 98

## 2023-06-25 NOTE — Discharge Summary (Signed)
Discharge Summary        DISCHARGE STEPS _ DELETE ME!!!!!!!!!!!!!!!  Prov nav > Discharge > DC orders > Med red (CHECK DO NOT SEND REFILLS TO ME)    Follow up providers    Discharge instructions  Discharge Summary note  ORDER - discharge patient     Patient ID: Danny Brown MRN: [id number] DOB/AGE: 03/22/1956 67 y.o.  Admit date: 06/19/2023 Discharge date: 06/25/2023  Primary Discharge Diagnosis Unstable angina Secondary Discharge Diagnosis S/p coronary stent placement   Significant Diagnostic Studies: Left heart catheterization  Consults: None  Hospital Course: The patient was brought to the cardiac cath lab and underwent left heart catheterization and coronary angiography with Dr. Juliann Pares on 11/27. The patient tolerated with procedure well without complications.  The patient received 2.5 x 26 mm frontier Onyx stent to the mid LAD . On 11/27 the right wrist access site was examined and found to be without significant erythema, tenderness to palpation, or apparent aneurysm. Hospital course was overall uneventful, on day of discharge the patient was ambulatory and eager to go home.  Discussed cardiac rehab and new prescription medications in detail. The patient was given aftercare instructions and ER return precautions. Will arrange for follow up in office in 1 week, or sooner if needed.      Discharge Exam: Blood pressure 130/81, pulse 80, temperature 98.1 F (36.7 C), temperature source Oral, resp. rate 20, height 5\' 3"  (1.6 m), weight 67.9 kg, SpO2 96%.    General: Well appearing, well nourished, in no acute distress.  HEENT:  Normocephalic and atraumatic. Neck:   No JVD.  Lungs: Normal respiratory effort on room air.  Clear to ascultation bilaterally. Heart: HRRR . Normal S1 and S2 without gallops or murmurs.  Abdomen: non-distended appearing.  Msk: Normal strength and tone for age. Extremities: No peripheral edema. R wrist access site without bleeding, significant  tenderness to palpation, apparent aneurysm, or significant ecchymosis. Covered with clean gauze and tegaderm dressing.  Neuro: Alert and oriented x3 Psych:  calm and cooperative.   Labs:   Lab Results  Component Value Date   WBC 7.2 06/11/2023   HGB 13.8 06/11/2023   HCT 39.9 06/11/2023   MCV 91.3 06/11/2023   PLT 247 06/11/2023   No results for input(s): "NA", "K", "CL", "CO2", "BUN", "CREATININE", "CALCIUM", "PROT", "BILITOT", "ALKPHOS", "ALT", "AST", "GLUCOSE" in the last 168 hours.  Invalid input(s): "LABALBU"    Radiology: None EKG: NSR rate 89 bpm  FOLLOW UP PLANS AND APPOINTMENTS Discharge Instructions     AMB Referral to Cardiac Rehabilitation - Phase II   Complete by: As directed    Diagnosis: Coronary Stents   After initial evaluation and assessments completed: Virtual Based Care may be provided alone or in conjunction with Phase 2 Cardiac Rehab based on patient barriers.: Yes      Allergies as of 06/19/2023       Reactions   Atorvastatin Other (See Comments)   Joint pain   Penicillins Itching   Rosuvastatin Other (See Comments)   Joint pain        Medication List     TAKE these medications    acetaminophen 500 MG tablet Commonly known as: TYLENOL Take 1,000 mg by mouth every 6 (six) hours as needed for moderate pain (pain score 4-6).   alfuzosin 10 MG 24 hr tablet Commonly known as: UROXATRAL Take 1 tablet (10 mg total) by mouth daily with breakfast.   aspirin EC 81 MG tablet Take  81 mg by mouth daily. Swallow whole.   atomoxetine 40 MG capsule Commonly known as: STRATTERA Take 40 mg by mouth every morning.   Brilinta 90 MG Tabs tablet Generic drug: ticagrelor Take 1 tablet (90 mg total) by mouth 2 (two) times daily.   enalapril 2.5 MG tablet Commonly known as: VASOTEC Take 2.5 mg by mouth daily.   ezetimibe 10 MG tablet Commonly known as: ZETIA Take 10 mg by mouth daily.   L-METHYLFOLATE PO Take 1 tablet by mouth daily.    metFORMIN 750 MG 24 hr tablet Commonly known as: GLUCOPHAGE-XR Take 750 mg by mouth 2 (two) times daily with a meal.   metoprolol succinate 25 MG 24 hr tablet Commonly known as: TOPROL-XL Take 25 mg by mouth daily.   pravastatin 40 MG tablet Commonly known as: PRAVACHOL Take 40 mg by mouth at bedtime.   tadalafil 5 MG tablet Commonly known as: CIALIS Take 1 tablet (5 mg total) by mouth daily as needed for erectile dysfunction. What changed: when to take this   tadalafil 20 MG tablet Commonly known as: CIALIS Take 1 tablet (20 mg total) by mouth daily as needed for erectile dysfunction. What changed: Another medication with the same name was changed. Make sure you understand how and when to take each.   Vilazodone HCl 40 MG Tabs Commonly known as: VIIBRYD Take 40 mg by mouth daily.        Follow-up Information     Alwyn Pea, MD. Go in 1 week(s).   Specialties: Cardiology, Internal Medicine Contact information: [address] [place] [address] [phone]                 PLEASE BRING ALL MEDICATIONS WITH YOU TO FOLLOW UP APPOINTMENTS  Time spent with patient: 21 Signed:  Gale Journey PA-C 06/25/2023, 7:38 AM [facility]

## 2023-06-28 ENCOUNTER — Other Ambulatory Visit: Payer: Self-pay | Admitting: Urology

## 2023-07-01 ENCOUNTER — Other Ambulatory Visit: Payer: Self-pay

## 2023-07-01 ENCOUNTER — Encounter: Payer: BC Managed Care – PPO | Attending: Internal Medicine

## 2023-07-01 DIAGNOSIS — Z48812 Encounter for surgical aftercare following surgery on the circulatory system: Secondary | ICD-10-CM | POA: Insufficient documentation

## 2023-07-01 DIAGNOSIS — Z955 Presence of coronary angioplasty implant and graft: Secondary | ICD-10-CM | POA: Insufficient documentation

## 2023-07-01 NOTE — Progress Notes (Signed)
Virtual Visit completed. Patient informed on EP and RD appointment and 6 Minute walk test. Patient also informed of patient health questionnaires on My Chart. Patient Verbalizes understanding. Visit diagnosis can be found in [facility] 06/19/2023.

## 2023-07-03 VITALS — Ht 63.58 in | Wt 156.8 lb

## 2023-07-03 DIAGNOSIS — Z48812 Encounter for surgical aftercare following surgery on the circulatory system: Secondary | ICD-10-CM | POA: Diagnosis present

## 2023-07-03 DIAGNOSIS — Z955 Presence of coronary angioplasty implant and graft: Secondary | ICD-10-CM

## 2023-07-03 NOTE — Progress Notes (Signed)
Cardiac Individual Treatment Plan  Patient Details  Name: Danny Brown MRN: [id number] Date of Birth: January 08, 1956 Referring Provider:   Flowsheet Row Cardiac Rehab from 07/03/2023 in [facility] Cardiac and Pulmonary Rehab  Referring Provider Dr. Dorothyann Peng       Initial Encounter Date:  Flowsheet Row Cardiac Rehab from 07/03/2023 in [facility] Cardiac and Pulmonary Rehab  Date 07/03/23       Visit Diagnosis: Status post coronary artery stent placement  Patient's Home Medications on Admission:  Current Outpatient Medications:    acetaminophen (TYLENOL) 500 MG tablet, Take 1,000 mg by mouth every 6 (six) hours as needed for moderate pain (pain score 4-6)., Disp: , Rfl:    alfuzosin (UROXATRAL) 10 MG 24 hr tablet, TAKE ONE TABLET BY MOUTH ONE TIME DAILY WITH BREAKFAST, Disp: 30 tablet, Rfl: 0   aspirin EC 81 MG tablet, Take 81 mg by mouth daily. Swallow whole., Disp: , Rfl:    atomoxetine (STRATTERA) 40 MG capsule, Take 40 mg by mouth every morning., Disp: , Rfl:    enalapril (VASOTEC) 2.5 MG tablet, Take 2.5 mg by mouth daily., Disp: , Rfl:    Enalapril-hydroCHLOROthiazide 5-12.5 MG tablet, SMARTSIG:1.0 Tablet(s) By Mouth Daily, Disp: , Rfl:    ezetimibe (ZETIA) 10 MG tablet, Take 10 mg by mouth daily., Disp: , Rfl:    L-METHYLFOLATE PO, Take 1 tablet by mouth daily., Disp: , Rfl:    metFORMIN (GLUCOPHAGE-XR) 750 MG 24 hr tablet, Take 750 mg by mouth 2 (two) times daily with a meal., Disp: , Rfl:    metoprolol succinate (TOPROL-XL) 25 MG 24 hr tablet, Take 25 mg by mouth daily., Disp: , Rfl:    pravastatin (PRAVACHOL) 40 MG tablet, Take 40 mg by mouth at bedtime., Disp: , Rfl:    tadalafil (CIALIS) 20 MG tablet, Take 1 tablet (20 mg total) by mouth daily as needed for erectile dysfunction., Disp: 90 tablet, Rfl: 3   tadalafil (CIALIS) 5 MG tablet, Take 1 tablet (5 mg total) by mouth daily as needed for erectile dysfunction. (Patient not taking: Reported on 07/01/2023), Disp: 90 tablet, Rfl:  3   ticagrelor (BRILINTA) 90 MG TABS tablet, Take 1 tablet (90 mg total) by mouth 2 (two) times daily., Disp: 60 tablet, Rfl: 0   Vilazodone HCl (VIIBRYD) 40 MG TABS, Take 40 mg by mouth daily., Disp: , Rfl:   Past Medical History: Past Medical History:  Diagnosis Date   Diabetes mellitus without complication (HCC)    Dyspnea    GERD (gastroesophageal reflux disease)    Gilbert syndrome    Heart murmur    Hypercholesterolemia    Hyperlipidemia    Hypertension    Hypochloremia    Sleep apnea     Tobacco Use: Social History   Tobacco Use  Smoking Status Former   Current packs/day: 1.00   Types: Cigarettes  Smokeless Tobacco Never  Tobacco Comments   Smoked for about 30 years    Labs: Review Flowsheet        No data to display           Exercise Target Goals: Exercise Program Goal: Individual exercise prescription set using results from initial 6 min walk test and THRR while considering  patient's activity barriers and safety.   Exercise Prescription Goal: Initial exercise prescription builds to 30-45 minutes a day of aerobic activity, 2-3 days per week.  Home exercise guidelines will be given to patient during program as part of exercise prescription that the participant will  acknowledge.   Education: Aerobic Exercise: - Group verbal and visual presentation on the components of exercise prescription. Introduces F.I.T.T principle from ACSM for exercise prescriptions.  Reviews F.I.T.T. principles of aerobic exercise including progression. Written material given at graduation. Flowsheet Row Cardiac Rehab from 07/03/2023 in [facility] Cardiac and Pulmonary Rehab  Education need identified 07/03/23       Education: Resistance Exercise: - Group verbal and visual presentation on the components of exercise prescription. Introduces F.I.T.T principle from ACSM for exercise prescriptions  Reviews F.I.T.T. principles of resistance exercise including progression. Written  material given at graduation.    Education: Exercise & Equipment Safety: - Individual verbal instruction and demonstration of equipment use and safety with use of the equipment. Flowsheet Row Cardiac Rehab from 07/03/2023 in [facility] Cardiac and Pulmonary Rehab  Date 07/03/23  Educator [facility]  Instruction Review Code 1- Verbalizes Understanding       Education: Exercise Physiology & General Exercise Guidelines: - Group verbal and written instruction with models to review the exercise physiology of the cardiovascular system and associated critical values. Provides general exercise guidelines with specific guidelines to those with heart or lung disease.    Education: Flexibility, Balance, Mind/Body Relaxation: - Group verbal and visual presentation with interactive activity on the components of exercise prescription. Introduces F.I.T.T principle from ACSM for exercise prescriptions. Reviews F.I.T.T. principles of flexibility and balance exercise training including progression. Also discusses the mind body connection.  Reviews various relaxation techniques to help reduce and manage stress (i.e. Deep breathing, progressive muscle relaxation, and visualization). Balance handout provided to take home. Written material given at graduation.   Activity Barriers & Risk Stratification:  Activity Barriers & Cardiac Risk Stratification - 07/03/23 1601       Activity Barriers & Cardiac Risk Stratification   Cardiac Risk Stratification Moderate             6 Minute Walk:  6 Minute Walk     Row Name 07/03/23 1559         6 Minute Walk   Phase Initial     Distance 1335 feet     Walk Time 6 minutes     # of Rest Breaks 0     MPH 2.5     METS 2.9     RPE 9     Perceived Dyspnea  0     VO2 Peak 10.3     Symptoms No     Resting HR 74 bpm     Resting BP 94/56     Resting Oxygen Saturation  97 %     Exercise Oxygen Saturation  during 6 min walk 96 %     Max Ex. HR 99 bpm     Max Ex. BP  128/64     2 Minute Post BP 112/64              Oxygen Initial Assessment:   Oxygen Re-Evaluation:   Oxygen Discharge (Final Oxygen Re-Evaluation):   Initial Exercise Prescription:  Initial Exercise Prescription - 07/03/23 1600       Date of Initial Exercise RX and Referring Provider   Date 07/03/23    Referring Provider Dr. Dorothyann Peng      Oxygen   Maintain Oxygen Saturation 88% or higher      Treadmill   MPH 2.3    Grade 1    Minutes 15    METs 3.08      Elliptical   Level 1    Speed  3    Minutes 15    METs 2.9      Biostep-RELP   Level 3    SPM 50    Minutes 15    METs 2.9      Intensity   THRR 40-80% of Max Heartrate 104-135    Ratings of Perceived Exertion 11-13    Perceived Dyspnea 0-4      Progression   Progression Continue to progress workloads to maintain intensity without signs/symptoms of physical distress.      Resistance Training   Training Prescription Yes    Weight 8lb    Reps 10-15             Perform Capillary Blood Glucose checks as needed.  Exercise Prescription Changes:   Exercise Prescription Changes     Row Name 07/03/23 1600             Response to Exercise   Blood Pressure (Admit) 94/56       Blood Pressure (Exercise) 128/64       Blood Pressure (Exit) 112/64       Heart Rate (Admit) 74 bpm       Heart Rate (Exercise) 99 bpm       Heart Rate (Exit) 75 bpm       Oxygen Saturation (Admit) 97 %       Oxygen Saturation (Exercise) 96 %       Oxygen Saturation (Exit) 96 %       Rating of Perceived Exertion (Exercise) 9       Perceived Dyspnea (Exercise) 0       Symptoms none       Comments results                Exercise Comments:   Exercise Goals and Review:   Exercise Goals     Row Name 07/03/23 1609             Exercise Goals   Increase Physical Activity Yes       Intervention Develop an individualized exercise prescription for aerobic and resistive training based on  initial evaluation findings, risk stratification, comorbidities and participant's personal goals.;Provide advice, education, support and counseling about physical activity/exercise needs.       Expected Outcomes Long Term: Exercising regularly at least 3-5 days a week.;Long Term: Add in home exercise to make exercise part of routine and to increase amount of physical activity.;Short Term: Attend rehab on a regular basis to increase amount of physical activity.       Increase Strength and Stamina Yes       Intervention Develop an individualized exercise prescription for aerobic and resistive training based on initial evaluation findings, risk stratification, comorbidities and participant's personal goals.;Provide advice, education, support and counseling about physical activity/exercise needs.       Expected Outcomes Long Term: Improve cardiorespiratory fitness, muscular endurance and strength as measured by increased METs and functional capacity ( );Short Term: Perform resistance training exercises routinely during rehab and add in resistance training at home;Short Term: Increase workloads from initial exercise prescription for resistance, speed, and METs.       Able to understand and use rate of perceived exertion (RPE) scale Yes       Intervention Provide education and explanation on how to use RPE scale       Expected Outcomes Long Term:  Able to use RPE to guide intensity level when exercising independently;Short Term: Able to use RPE daily in rehab to  express subjective intensity level       Able to understand and use Dyspnea scale Yes       Intervention Provide education and explanation on how to use Dyspnea scale       Expected Outcomes Long Term: Able to use Dyspnea scale to guide intensity level when exercising independently;Short Term: Able to use Dyspnea scale daily in rehab to express subjective sense of shortness of breath during exertion       Knowledge and understanding of Target Heart  Rate Range (THRR) Yes       Intervention Provide education and explanation of THRR including how the numbers were predicted and where they are located for reference       Expected Outcomes Long Term: Able to use THRR to govern intensity when exercising independently;Short Term: Able to use daily as guideline for intensity in rehab;Short Term: Able to state/look up THRR       Able to check pulse independently Yes       Intervention Review the importance of being able to check your own pulse for safety during independent exercise;Provide education and demonstration on how to check pulse in carotid and radial arteries.       Expected Outcomes Long Term: Able to check pulse independently and accurately;Short Term: Able to explain why pulse checking is important during independent exercise       Understanding of Exercise Prescription Yes       Intervention Provide education, explanation, and written materials on patient's individual exercise prescription       Expected Outcomes Long Term: Able to explain home exercise prescription to exercise independently;Short Term: Able to explain program exercise prescription                Exercise Goals Re-Evaluation :   Discharge Exercise Prescription (Final Exercise Prescription Changes):  Exercise Prescription Changes - 07/03/23 1600       Response to Exercise   Blood Pressure (Admit) 94/56    Blood Pressure (Exercise) 128/64    Blood Pressure (Exit) 112/64    Heart Rate (Admit) 74 bpm    Heart Rate (Exercise) 99 bpm    Heart Rate (Exit) 75 bpm    Oxygen Saturation (Admit) 97 %    Oxygen Saturation (Exercise) 96 %    Oxygen Saturation (Exit) 96 %    Rating of Perceived Exertion (Exercise) 9    Perceived Dyspnea (Exercise) 0    Symptoms none    Comments results             Nutrition:  Target Goals: Understanding of nutrition guidelines, daily intake of sodium 1500mg , cholesterol 200mg , calories 30% from fat and 7% or less from  saturated fats, daily to have 5 or more servings of fruits and vegetables.  Education: All About Nutrition: -Group instruction provided by verbal, written material, interactive activities, discussions, models, and posters to present general guidelines for heart healthy nutrition including fat, fiber, MyPlate, the role of sodium in heart healthy nutrition, utilization of the nutrition label, and utilization of this knowledge for meal planning. Follow up email sent as well. Written material given at graduation. Flowsheet Row Cardiac Rehab from 07/03/2023 in [facility] Cardiac and Pulmonary Rehab  Education need identified 07/03/23       Biometrics:  Pre Biometrics - 07/03/23 1609       Pre Biometrics   Height 5' 3.58" (1.615 m)    Weight 156 lb 12.8 oz (71.1 kg)    Waist Circumference 38.5  inches    Hip Circumference 36 inches    Waist to Hip Ratio 1.07 %    BMI (Calculated) 27.27    Single Leg Stand 30 seconds              Nutrition Therapy Plan and Nutrition Goals:   Nutrition Assessments:  MEDIFICTS Score Key: >=70 Need to make dietary changes  40-70 Heart Healthy Diet <= 40 Therapeutic Level Cholesterol Diet  Flowsheet Row Cardiac Rehab from 07/03/2023 in [facility] Cardiac and Pulmonary Rehab  Picture Your Plate Total Score on Admission 68      Picture Your Plate Scores: <87 Unhealthy dietary pattern with much room for improvement. 41-50 Dietary pattern unlikely to meet recommendations for good health and room for improvement. 51-60 More healthful dietary pattern, with some room for improvement.  >60 Healthy dietary pattern, although there may be some specific behaviors that could be improved.    Nutrition Goals Re-Evaluation:   Nutrition Goals Discharge (Final Nutrition Goals Re-Evaluation):   Psychosocial: Target Goals: Acknowledge presence or absence of significant depression and/or stress, maximize coping skills, provide positive support system. Participant is  able to verbalize types and ability to use techniques and skills needed for reducing stress and depression.   Education: Stress, Anxiety, and Depression - Group verbal and visual presentation to define topics covered.  Reviews how body is impacted by stress, anxiety, and depression.  Also discusses healthy ways to reduce stress and to treat/manage anxiety and depression.  Written material given at graduation.   Education: Sleep Hygiene -Provides group verbal and written instruction about how sleep can affect your health.  Define sleep hygiene, discuss sleep cycles and impact of sleep habits. Review good sleep hygiene tips.    Initial Review & Psychosocial Screening:  Initial Psych Review & Screening - 07/01/23 1114       Initial Review   Current issues with Current Psychotropic Meds;Current Anxiety/Panic;Current Depression      Family Dynamics   Good Support System? Yes    Comments His depression is just "there" and worries about everything. He lives his wife that is a great support system. His daughters and his therapist are good supporters also.      Barriers   Psychosocial barriers to participate in program The patient should benefit from training in stress management and relaxation.      Screening Interventions   Interventions Encouraged to exercise;To provide support and resources with identified psychosocial needs;Provide feedback about the scores to participant    Expected Outcomes Short Term goal: Utilizing psychosocial counselor, staff and physician to assist with identification of specific Stressors or current issues interfering with healing process. Setting desired goal for each stressor or current issue identified.;Long Term Goal: Stressors or current issues are controlled or eliminated.;Long Term goal: The participant improves quality of Life and PHQ9 Scores as seen by post scores and/or verbalization of changes;Short Term goal: Identification and review with participant of any  Quality of Life or Depression concerns found by scoring the questionnaire.             Quality of Life Scores:   Quality of Life - 07/03/23 1604       Quality of Life   Select Quality of Life      Quality of Life Scores   Health/Function Pre 25.6 %    Socioeconomic Pre 22.75 %    Psych/Spiritual Pre 24.21 %    Family Pre 27.6 %    GLOBAL Pre 24.96 %  Scores of 19 and below usually indicate a poorer quality of life in these areas.  A difference of  2-3 points is a clinically meaningful difference.  A difference of 2-3 points in the total score of the Quality of Life Index has been associated with significant improvement in overall quality of life, self-image, physical symptoms, and general health in studies assessing change in quality of life.  PHQ-9: Review Flowsheet       07/03/2023  Depression screen PHQ 2/9  Decreased Interest 0  Down, Depressed, Hopeless 1  PHQ - 2 Score 1  Altered sleeping 0  Tired, decreased energy 1  Change in appetite 0  Feeling bad or failure about yourself  0  Trouble concentrating 0  Moving slowly or fidgety/restless 1  Suicidal thoughts 0  PHQ-9 Score 3  Difficult doing work/chores Not difficult at all    Details           Interpretation of Total Score  Total Score Depression Severity:  1-4 = Minimal depression, 5-9 = Mild depression, 10-14 = Moderate depression, 15-19 = Moderately severe depression, 20-27 = Severe depression   Psychosocial Evaluation and Intervention:  Psychosocial Evaluation - 07/01/23 1116       Psychosocial Evaluation & Interventions   Interventions Relaxation education;Stress management education;Encouraged to exercise with the program and follow exercise prescription    Comments His depression is just "there" and worries about everything. He lives his wife that is a great support system. His daughters and his therapist are good supporters also.    Expected Outcomes Short: Start Heart[facility] to  help with mood. Long: Maintain a healthy mental state    Continue Psychosocial Services  Follow up required by staff             Psychosocial Re-Evaluation:   Psychosocial Discharge (Final Psychosocial Re-Evaluation):   Vocational Rehabilitation: Provide vocational rehab assistance to qualifying candidates.   Vocational Rehab Evaluation & Intervention:   Education: Education Goals: Education classes will be provided on a variety of topics geared toward better understanding of heart health and risk factor modification. Participant will state understanding/return demonstration of topics presented as noted by education test scores.  Learning Barriers/Preferences:  Learning Barriers/Preferences - 07/01/23 1113       Learning Barriers/Preferences   Learning Barriers None    Learning Preferences None             General Cardiac Education Topics:  AED/CPR: - Group verbal and written instruction with the use of models to demonstrate the basic use of the AED with the basic ABC's of resuscitation.   Anatomy and Cardiac Procedures: - Group verbal and visual presentation and models provide information about basic cardiac anatomy and function. Reviews the testing methods done to diagnose heart disease and the outcomes of the test results. Describes the treatment choices: Medical Management, Angioplasty, or Coronary Bypass Surgery for treating various heart conditions including Myocardial Infarction, Angina, Valve Disease, and Cardiac Arrhythmias.  Written material given at graduation. Flowsheet Row Cardiac Rehab from 07/03/2023 in [facility] Cardiac and Pulmonary Rehab  Education need identified 07/03/23       Medication Safety: - Group verbal and visual instruction to review commonly prescribed medications for heart and lung disease. Reviews the medication, class of the drug, and side effects. Includes the steps to properly store meds and maintain the prescription regimen.  Written  material given at graduation.   Intimacy: - Group verbal instruction through game format to discuss how heart and lung disease  can affect sexual intimacy. Written material given at graduation..   Know Your Numbers and Heart Failure: - Group verbal and visual instruction to discuss disease risk factors for cardiac and pulmonary disease and treatment options.  Reviews associated critical values for Overweight/Obesity, Hypertension, Cholesterol, and Diabetes.  Discusses basics of heart failure: signs/symptoms and treatments.  Introduces Heart Failure Zone chart for action plan for heart failure.  Written material given at graduation.   Infection Prevention: - Provides verbal and written material to individual with discussion of infection control including proper hand washing and proper equipment cleaning during exercise session. Flowsheet Row Cardiac Rehab from 07/03/2023 in [facility] Cardiac and Pulmonary Rehab  Date 07/03/23  Educator [facility]  Instruction Review Code 1- Verbalizes Understanding       Falls Prevention: - Provides verbal and written material to individual with discussion of falls prevention and safety. Flowsheet Row Cardiac Rehab from 07/03/2023 in [facility] Cardiac and Pulmonary Rehab  Date 07/03/23  Educator [facility]  Instruction Review Code 1- Verbalizes Understanding       Other: -Provides group and verbal instruction on various topics (see comments)   Knowledge Questionnaire Score:  Knowledge Questionnaire Score - 07/03/23 1606       Knowledge Questionnaire Score   Pre Score 22             Core Components/Risk Factors/Patient Goals at Admission:  Personal Goals and Risk Factors at Admission - 07/03/23 1606       Core Components/Risk Factors/Patient Goals on Admission    Weight Management Yes;Weight Loss    Intervention Weight Management: Develop a combined nutrition and exercise program designed to reach desired caloric intake, while maintaining appropriate intake  of nutrient and fiber, sodium and fats, and appropriate energy expenditure required for the weight goal.;Weight Management: Provide education and appropriate resources to help participant work on and attain dietary goals.;Weight Management/Obesity: Establish reasonable short term and long term weight goals.    Admit Weight 156 lb 12.8 oz (71.1 kg)    Goal Weight: Short Term 150 lb (68 kg)    Goal Weight: Long Term 140 lb (63.5 kg)    Expected Outcomes Short Term: Continue to assess and modify interventions until short term weight is achieved;Long Term: Adherence to nutrition and physical activity/exercise program aimed toward attainment of established weight goal;Weight Loss: Understanding of general recommendations for a balanced deficit meal plan, which promotes 1-2 lb weight loss per week and includes a negative energy balance of [id number] kcal/d;Understanding recommendations for meals to include 15-35% energy as protein, 25-35% energy from fat, 35-60% energy from carbohydrates, less than 200mg  of dietary cholesterol, 20-35 gm of total fiber daily;Understanding of distribution of calorie intake throughout the day with the consumption of 4-5 meals/snacks    Diabetes Yes    Intervention Provide education about signs/symptoms and action to take for hypo/hyperglycemia.;Provide education about proper nutrition, including hydration, and aerobic/resistive exercise prescription along with prescribed medications to achieve blood glucose in normal ranges: Fasting glucose 65-99 mg/dL    Expected Outcomes Long Term: Attainment of HbA1C < 7%.;Short Term: Participant verbalizes understanding of the signs/symptoms and immediate care of hyper/hypoglycemia, proper foot care and importance of medication, aerobic/resistive exercise and nutrition plan for blood glucose control.    Hypertension Yes    Intervention Provide education on lifestyle modifcations including regular physical activity/exercise, weight management,  moderate sodium restriction and increased consumption of fresh fruit, vegetables, and low fat dairy, alcohol moderation, and smoking cessation.;Monitor prescription use compliance.    Expected  Outcomes Short Term: Continued assessment and intervention until BP is < 140/67mm HG in hypertensive participants. < 130/110mm HG in hypertensive participants with diabetes, heart failure or chronic kidney disease.;Long Term: Maintenance of blood pressure at goal levels.    Lipids Yes    Intervention Provide education and support for participant on nutrition & aerobic/resistive exercise along with prescribed medications to achieve LDL 70mg , HDL >40mg .    Expected Outcomes Short Term: Participant states understanding of desired cholesterol values and is compliant with medications prescribed. Participant is following exercise prescription and nutrition guidelines.;Long Term: Cholesterol controlled with medications as prescribed, with individualized exercise RX and with personalized nutrition plan. Value goals: LDL < 70mg , HDL > 40 mg.             Education:Diabetes - Individual verbal and written instruction to review signs/symptoms of diabetes, desired ranges of glucose level fasting, after meals and with exercise. Acknowledge that pre and post exercise glucose checks will be done for 3 sessions at entry of program. Flowsheet Row Cardiac Rehab from 07/03/2023 in [facility]  Date 07/03/23  Educator [facility]  Instruction Review Code 1- Verbalizes Understanding       Core Components/Risk Factors/Patient Goals Review:    Core Components/Risk Factors/Patient Goals at Discharge (Final Review):    ITP Comments:  ITP Comments     Row Name 07/01/23 1112 07/03/23 1559         ITP Comments Virtual Visit completed. Patient informed on EP and RD appointment and 6 Minute walk test. Patient also informed of patient health questionnaires on My Chart. Patient Verbalizes understanding. Visit  diagnosis can be found in [facility] 06/19/2023. Completed and gym orientation. Initial ITP created and sent for review to Dr. Bethann Punches, Medical Director.               Comments: Initial ITP

## 2023-07-03 NOTE — Patient Instructions (Addendum)
Patient Instructions  Patient Details  Name: Danny Brown MRN: [id number] Date of Birth: 03-16-1956 Referring Provider:  Alwyn Pea, MD  Below are your personal goals for exercise, nutrition, and risk factors. Our goal is to help you stay on track towards obtaining and maintaining these goals. We will be discussing your progress on these goals with you throughout the program.  Initial Exercise Prescription:  Initial Exercise Prescription - 07/03/23 1600       Date of Initial Exercise RX and Referring Provider   Date 07/03/23    Referring Provider Dr. Dorothyann Peng      Oxygen   Maintain Oxygen Saturation 88% or higher      Treadmill   MPH 2.3    Grade 1    Minutes 15    METs 3.08      Elliptical   Level 1    Speed 3    Minutes 15    METs 2.9      Biostep-RELP   Level 3    SPM 50    Minutes 15    METs 2.9      Intensity   THRR 40-80% of Max Heartrate 104-135    Ratings of Perceived Exertion 11-13    Perceived Dyspnea 0-4      Progression   Progression Continue to progress workloads to maintain intensity without signs/symptoms of physical distress.      Resistance Training   Training Prescription Yes    Weight 8lb    Reps 10-15             Exercise Goals: Frequency: Be able to perform aerobic exercise two to three times per week in program working toward 2-5 days per week of home exercise.  Intensity: Work with a perceived exertion of 11 (fairly light) - 15 (hard) while following your exercise prescription.  We will make changes to your prescription with you as you progress through the program.   Duration: Be able to do 30 to 45 minutes of continuous aerobic exercise in addition to a 5 minute warm-up and a 5 minute cool-down routine.   Nutrition Goals: Your personal nutrition goals will be established when you do your nutrition analysis with the dietician.  The following are general nutrition guidelines to follow: Cholesterol <  200mg /day Sodium < 1500mg /day Fiber: Men over 50 yrs - 30 grams per day  Personal Goals:  Personal Goals and Risk Factors at Admission - 07/03/23 1606       Core Components/Risk Factors/Patient Goals on Admission    Weight Management Yes;Weight Loss    Intervention Weight Management: Develop a combined nutrition and exercise program designed to reach desired caloric intake, while maintaining appropriate intake of nutrient and fiber, sodium and fats, and appropriate energy expenditure required for the weight goal.;Weight Management: Provide education and appropriate resources to help participant work on and attain dietary goals.;Weight Management/Obesity: Establish reasonable short term and long term weight goals.    Admit Weight 156 lb 12.8 oz (71.1 kg)    Goal Weight: Short Term 150 lb (68 kg)    Goal Weight: Long Term 140 lb (63.5 kg)    Expected Outcomes Short Term: Continue to assess and modify interventions until short term weight is achieved;Long Term: Adherence to nutrition and physical activity/exercise program aimed toward attainment of established weight goal;Weight Loss: Understanding of general recommendations for a balanced deficit meal plan, which promotes 1-2 lb weight loss per week and includes a negative energy balance of [phone] kcal/d;Understanding recommendations  for meals to include 15-35% energy as protein, 25-35% energy from fat, 35-60% energy from carbohydrates, less than 200mg  of dietary cholesterol, 20-35 gm of total fiber daily;Understanding of distribution of calorie intake throughout the day with the consumption of 4-5 meals/snacks    Diabetes Yes    Intervention Provide education about signs/symptoms and action to take for hypo/hyperglycemia.;Provide education about proper nutrition, including hydration, and aerobic/resistive exercise prescription along with prescribed medications to achieve blood glucose in normal ranges: Fasting glucose 65-99 mg/dL    Expected  Outcomes Long Term: Attainment of HbA1C < 7%.;Short Term: Participant verbalizes understanding of the signs/symptoms and immediate care of hyper/hypoglycemia, proper foot care and importance of medication, aerobic/resistive exercise and nutrition plan for blood glucose control.    Hypertension Yes    Intervention Provide education on lifestyle modifcations including regular physical activity/exercise, weight management, moderate sodium restriction and increased consumption of fresh fruit, vegetables, and low fat dairy, alcohol moderation, and smoking cessation.;Monitor prescription use compliance.    Expected Outcomes Short Term: Continued assessment and intervention until BP is < 140/68mm HG in hypertensive participants. < 130/66mm HG in hypertensive participants with diabetes, heart failure or chronic kidney disease.;Long Term: Maintenance of blood pressure at goal levels.    Lipids Yes    Intervention Provide education and support for participant on nutrition & aerobic/resistive exercise along with prescribed medications to achieve LDL 70mg , HDL >40mg .    Expected Outcomes Short Term: Participant states understanding of desired cholesterol values and is compliant with medications prescribed. Participant is following exercise prescription and nutrition guidelines.;Long Term: Cholesterol controlled with medications as prescribed, with individualized exercise RX and with personalized nutrition plan. Value goals: LDL < 70mg , HDL > 40 mg.             Tobacco Use Initial Evaluation: Social History   Tobacco Use  Smoking Status Former   Current packs/day: 1.00   Types: Cigarettes  Smokeless Tobacco Never  Tobacco Comments   Smoked for about 30 years    Exercise Goals and Review:  Exercise Goals     Row Name 07/03/23 1609             Exercise Goals   Increase Physical Activity Yes       Intervention Develop an individualized exercise prescription for aerobic and resistive training  based on initial evaluation findings, risk stratification, comorbidities and participant's personal goals.;Provide advice, education, support and counseling about physical activity/exercise needs.       Expected Outcomes Long Term: Exercising regularly at least 3-5 days a week.;Long Term: Add in home exercise to make exercise part of routine and to increase amount of physical activity.;Short Term: Attend rehab on a regular basis to increase amount of physical activity.       Increase Strength and Stamina Yes       Intervention Develop an individualized exercise prescription for aerobic and resistive training based on initial evaluation findings, risk stratification, comorbidities and participant's personal goals.;Provide advice, education, support and counseling about physical activity/exercise needs.       Expected Outcomes Long Term: Improve cardiorespiratory fitness, muscular endurance and strength as measured by increased METs and functional capacity ( );Short Term: Perform resistance training exercises routinely during rehab and add in resistance training at home;Short Term: Increase workloads from initial exercise prescription for resistance, speed, and METs.       Able to understand and use rate of perceived exertion (RPE) scale Yes       Intervention Provide education and explanation  on how to use RPE scale       Expected Outcomes Long Term:  Able to use RPE to guide intensity level when exercising independently;Short Term: Able to use RPE daily in rehab to express subjective intensity level       Able to understand and use Dyspnea scale Yes       Intervention Provide education and explanation on how to use Dyspnea scale       Expected Outcomes Long Term: Able to use Dyspnea scale to guide intensity level when exercising independently;Short Term: Able to use Dyspnea scale daily in rehab to express subjective sense of shortness of breath during exertion       Knowledge and understanding of  Target Heart Rate Range (THRR) Yes       Intervention Provide education and explanation of THRR including how the numbers were predicted and where they are located for reference       Expected Outcomes Long Term: Able to use THRR to govern intensity when exercising independently;Short Term: Able to use daily as guideline for intensity in rehab;Short Term: Able to state/look up THRR       Able to check pulse independently Yes       Intervention Review the importance of being able to check your own pulse for safety during independent exercise;Provide education and demonstration on how to check pulse in carotid and radial arteries.       Expected Outcomes Long Term: Able to check pulse independently and accurately;Short Term: Able to explain why pulse checking is important during independent exercise       Understanding of Exercise Prescription Yes       Intervention Provide education, explanation, and written materials on patient's individual exercise prescription       Expected Outcomes Long Term: Able to explain home exercise prescription to exercise independently;Short Term: Able to explain program exercise prescription                Copy of goals given to participant.

## 2023-07-08 ENCOUNTER — Ambulatory Visit: Payer: Medicare Other

## 2023-07-10 ENCOUNTER — Encounter: Payer: BC Managed Care – PPO | Admitting: *Deleted

## 2023-07-10 ENCOUNTER — Encounter: Payer: Self-pay | Admitting: *Deleted

## 2023-07-10 DIAGNOSIS — Z955 Presence of coronary angioplasty implant and graft: Secondary | ICD-10-CM

## 2023-07-10 DIAGNOSIS — Z48812 Encounter for surgical aftercare following surgery on the circulatory system: Secondary | ICD-10-CM | POA: Diagnosis not present

## 2023-07-10 LAB — GLUCOSE, CAPILLARY
Glucose-Capillary: 128 mg/dL — ABNORMAL HIGH (ref 70–99)
Glucose-Capillary: 110 mg/dL — ABNORMAL HIGH (ref 70–99)

## 2023-07-10 NOTE — Progress Notes (Signed)
Daily Session Note  Patient Details  Name: Danny Brown MRN: [id number] Date of Birth: 12/20/1955 Referring Provider:   Flowsheet Row Cardiac Rehab from 07/03/2023 in [facility] Cardiac and Pulmonary Rehab  Referring Provider Dr. Dorothyann Peng       Encounter Date: 07/10/2023  Check In:  Session Check In - 07/10/23 1724       Check-In   Supervising physician immediately available to respond to emergencies See telemetry face sheet for immediately available ER MD    Location [facility]-Cardiac & Pulmonary Rehab    Staff Present Susann Givens, RN BSN;Joseph Mod[place], RCP,RRT,BSRT;Kelly Alamo, [place], ACSM CEP, Exercise Physiologist    Virtual Visit No    Medication changes reported     No    Fall or balance concerns reported    No    Warm-up and Cool-down Performed on first and last piece of equipment    Resistance Training Performed Yes    VAD Patient? No    PAD/SET Patient? No      Pain Assessment   Currently in Pain? No/denies                Social History   Tobacco Use  Smoking Status Former   Current packs/day: 1.00   Types: Cigarettes  Smokeless Tobacco Never  Tobacco Comments   Smoked for about 30 years    Goals Met:  Independence with exercise equipment Exercise tolerated well No report of concerns or symptoms today Strength training completed today  Goals Unmet:  Not Applicable  Comments: First full day of exercise!  Patient was oriented to gym and equipment including functions, settings, policies, and procedures.  Patient's individual exercise prescription and treatment plan were reviewed.  All starting workloads were established based on the results of the 6 minute walk test done at initial orientation visit.  The plan for exercise progression was also introduced and progression will be customized based on patient's performance and goals.    Dr. Bethann Punches is Medical Director for [facility] Cardiac Rehabilitation.  Dr. Vida Rigger is Medical Director  for [facility] Pulmonary Rehabilitation.

## 2023-07-10 NOTE — Progress Notes (Signed)
Cardiac Individual Treatment Plan  Patient Details  Name: Danny Brown MRN: [id number] Date of Birth: 1955-10-06 Referring Provider:   Flowsheet Row Cardiac Rehab from 07/03/2023 in [facility] Cardiac and Pulmonary Rehab  Referring Provider Dr. Dorothyann Peng       Initial Encounter Date:  Flowsheet Row Cardiac Rehab from 07/03/2023 in [facility] Cardiac and Pulmonary Rehab  Date 07/03/23       Visit Diagnosis: Status post coronary artery stent placement  Patient's Home Medications on Admission:  Current Outpatient Medications:    acetaminophen (TYLENOL) 500 MG tablet, Take 1,000 mg by mouth every 6 (six) hours as needed for moderate pain (pain score 4-6)., Disp: , Rfl:    alfuzosin (UROXATRAL) 10 MG 24 hr tablet, TAKE ONE TABLET BY MOUTH ONE TIME DAILY WITH BREAKFAST, Disp: 30 tablet, Rfl: 0   aspirin EC 81 MG tablet, Take 81 mg by mouth daily. Swallow whole., Disp: , Rfl:    atomoxetine (STRATTERA) 40 MG capsule, Take 40 mg by mouth every morning., Disp: , Rfl:    enalapril (VASOTEC) 2.5 MG tablet, Take 2.5 mg by mouth daily., Disp: , Rfl:    Enalapril-hydroCHLOROthiazide 5-12.5 MG tablet, SMARTSIG:1.0 Tablet(s) By Mouth Daily, Disp: , Rfl:    ezetimibe (ZETIA) 10 MG tablet, Take 10 mg by mouth daily., Disp: , Rfl:    L-METHYLFOLATE PO, Take 1 tablet by mouth daily., Disp: , Rfl:    metFORMIN (GLUCOPHAGE-XR) 750 MG 24 hr tablet, Take 750 mg by mouth 2 (two) times daily with a meal., Disp: , Rfl:    metoprolol succinate (TOPROL-XL) 25 MG 24 hr tablet, Take 25 mg by mouth daily., Disp: , Rfl:    pravastatin (PRAVACHOL) 40 MG tablet, Take 40 mg by mouth at bedtime., Disp: , Rfl:    tadalafil (CIALIS) 20 MG tablet, Take 1 tablet (20 mg total) by mouth daily as needed for erectile dysfunction., Disp: 90 tablet, Rfl: 3   tadalafil (CIALIS) 5 MG tablet, Take 1 tablet (5 mg total) by mouth daily as needed for erectile dysfunction. (Patient not taking: Reported on 07/01/2023), Disp: 90 tablet, Rfl:  3   ticagrelor (BRILINTA) 90 MG TABS tablet, Take 1 tablet (90 mg total) by mouth 2 (two) times daily., Disp: 60 tablet, Rfl: 0   Vilazodone HCl (VIIBRYD) 40 MG TABS, Take 40 mg by mouth daily., Disp: , Rfl:   Past Medical History: Past Medical History:  Diagnosis Date   Diabetes mellitus without complication (HCC)    Dyspnea    GERD (gastroesophageal reflux disease)    Gilbert syndrome    Heart murmur    Hypercholesterolemia    Hyperlipidemia    Hypertension    Hypochloremia    Sleep apnea     Tobacco Use: Social History   Tobacco Use  Smoking Status Former   Current packs/day: 1.00   Types: Cigarettes  Smokeless Tobacco Never  Tobacco Comments   Smoked for about 30 years    Labs: Review Flowsheet        No data to display           Exercise Target Goals: Exercise Program Goal: Individual exercise prescription set using results from initial 6 min walk test and THRR while considering  patient's activity barriers and safety.   Exercise Prescription Goal: Initial exercise prescription builds to 30-45 minutes a day of aerobic activity, 2-3 days per week.  Home exercise guidelines will be given to patient during program as part of exercise prescription that the participant will  acknowledge.   Education: Aerobic Exercise: - Group verbal and visual presentation on the components of exercise prescription. Introduces F.I.T.T principle from ACSM for exercise prescriptions.  Reviews F.I.T.T. principles of aerobic exercise including progression. Written material given at graduation. Flowsheet Row Cardiac Rehab from 07/03/2023 in [facility] Cardiac and Pulmonary Rehab  Education need identified 07/03/23       Education: Resistance Exercise: - Group verbal and visual presentation on the components of exercise prescription. Introduces F.I.T.T principle from ACSM for exercise prescriptions  Reviews F.I.T.T. principles of resistance exercise including progression. Written  material given at graduation.    Education: Exercise & Equipment Safety: - Individual verbal instruction and demonstration of equipment use and safety with use of the equipment. Flowsheet Row Cardiac Rehab from 07/03/2023 in [facility] Cardiac and Pulmonary Rehab  Date 07/03/23  Educator [facility]  Instruction Review Code 1- Verbalizes Understanding       Education: Exercise Physiology & General Exercise Guidelines: - Group verbal and written instruction with models to review the exercise physiology of the cardiovascular system and associated critical values. Provides general exercise guidelines with specific guidelines to those with heart or lung disease.    Education: Flexibility, Balance, Mind/Body Relaxation: - Group verbal and visual presentation with interactive activity on the components of exercise prescription. Introduces F.I.T.T principle from ACSM for exercise prescriptions. Reviews F.I.T.T. principles of flexibility and balance exercise training including progression. Also discusses the mind body connection.  Reviews various relaxation techniques to help reduce and manage stress (i.e. Deep breathing, progressive muscle relaxation, and visualization). Balance handout provided to take home. Written material given at graduation.   Activity Barriers & Risk Stratification:  Activity Barriers & Cardiac Risk Stratification - 07/03/23 1601       Activity Barriers & Cardiac Risk Stratification   Cardiac Risk Stratification Moderate             6 Minute Walk:  6 Minute Walk     Row Name 07/03/23 1559         6 Minute Walk   Phase Initial     Distance 1335 feet     Walk Time 6 minutes     # of Rest Breaks 0     MPH 2.5     METS 2.9     RPE 9     Perceived Dyspnea  0     VO2 Peak 10.3     Symptoms No     Resting HR 74 bpm     Resting BP 94/56     Resting Oxygen Saturation  97 %     Exercise Oxygen Saturation  during 6 min walk 96 %     Max Ex. HR 99 bpm     Max Ex. BP  128/64     2 Minute Post BP 112/64              Oxygen Initial Assessment:   Oxygen Re-Evaluation:   Oxygen Discharge (Final Oxygen Re-Evaluation):   Initial Exercise Prescription:  Initial Exercise Prescription - 07/03/23 1600       Date of Initial Exercise RX and Referring Provider   Date 07/03/23    Referring Provider Dr. Dorothyann Peng      Oxygen   Maintain Oxygen Saturation 88% or higher      Treadmill   MPH 2.3    Grade 1    Minutes 15    METs 3.08      Elliptical   Level 1    Speed  3    Minutes 15    METs 2.9      Biostep-RELP   Level 3    SPM 50    Minutes 15    METs 2.9      Intensity   THRR 40-80% of Max Heartrate 104-135    Ratings of Perceived Exertion 11-13    Perceived Dyspnea 0-4      Progression   Progression Continue to progress workloads to maintain intensity without signs/symptoms of physical distress.      Resistance Training   Training Prescription Yes    Weight 8lb    Reps 10-15             Perform Capillary Blood Glucose checks as needed.  Exercise Prescription Changes:   Exercise Prescription Changes     Row Name 07/03/23 1600             Response to Exercise   Blood Pressure (Admit) 94/56       Blood Pressure (Exercise) 128/64       Blood Pressure (Exit) 112/64       Heart Rate (Admit) 74 bpm       Heart Rate (Exercise) 99 bpm       Heart Rate (Exit) 75 bpm       Oxygen Saturation (Admit) 97 %       Oxygen Saturation (Exercise) 96 %       Oxygen Saturation (Exit) 96 %       Rating of Perceived Exertion (Exercise) 9       Perceived Dyspnea (Exercise) 0       Symptoms none       Comments results                Exercise Comments:   Exercise Goals and Review:   Exercise Goals     Row Name 07/03/23 1609             Exercise Goals   Increase Physical Activity Yes       Intervention Develop an individualized exercise prescription for aerobic and resistive training based on  initial evaluation findings, risk stratification, comorbidities and participant's personal goals.;Provide advice, education, support and counseling about physical activity/exercise needs.       Expected Outcomes Long Term: Exercising regularly at least 3-5 days a week.;Long Term: Add in home exercise to make exercise part of routine and to increase amount of physical activity.;Short Term: Attend rehab on a regular basis to increase amount of physical activity.       Increase Strength and Stamina Yes       Intervention Develop an individualized exercise prescription for aerobic and resistive training based on initial evaluation findings, risk stratification, comorbidities and participant's personal goals.;Provide advice, education, support and counseling about physical activity/exercise needs.       Expected Outcomes Long Term: Improve cardiorespiratory fitness, muscular endurance and strength as measured by increased METs and functional capacity ( );Short Term: Perform resistance training exercises routinely during rehab and add in resistance training at home;Short Term: Increase workloads from initial exercise prescription for resistance, speed, and METs.       Able to understand and use rate of perceived exertion (RPE) scale Yes       Intervention Provide education and explanation on how to use RPE scale       Expected Outcomes Long Term:  Able to use RPE to guide intensity level when exercising independently;Short Term: Able to use RPE daily in rehab to  express subjective intensity level       Able to understand and use Dyspnea scale Yes       Intervention Provide education and explanation on how to use Dyspnea scale       Expected Outcomes Long Term: Able to use Dyspnea scale to guide intensity level when exercising independently;Short Term: Able to use Dyspnea scale daily in rehab to express subjective sense of shortness of breath during exertion       Knowledge and understanding of Target Heart  Rate Range (THRR) Yes       Intervention Provide education and explanation of THRR including how the numbers were predicted and where they are located for reference       Expected Outcomes Long Term: Able to use THRR to govern intensity when exercising independently;Short Term: Able to use daily as guideline for intensity in rehab;Short Term: Able to state/look up THRR       Able to check pulse independently Yes       Intervention Review the importance of being able to check your own pulse for safety during independent exercise;Provide education and demonstration on how to check pulse in carotid and radial arteries.       Expected Outcomes Long Term: Able to check pulse independently and accurately;Short Term: Able to explain why pulse checking is important during independent exercise       Understanding of Exercise Prescription Yes       Intervention Provide education, explanation, and written materials on patient's individual exercise prescription       Expected Outcomes Long Term: Able to explain home exercise prescription to exercise independently;Short Term: Able to explain program exercise prescription                Exercise Goals Re-Evaluation :   Discharge Exercise Prescription (Final Exercise Prescription Changes):  Exercise Prescription Changes - 07/03/23 1600       Response to Exercise   Blood Pressure (Admit) 94/56    Blood Pressure (Exercise) 128/64    Blood Pressure (Exit) 112/64    Heart Rate (Admit) 74 bpm    Heart Rate (Exercise) 99 bpm    Heart Rate (Exit) 75 bpm    Oxygen Saturation (Admit) 97 %    Oxygen Saturation (Exercise) 96 %    Oxygen Saturation (Exit) 96 %    Rating of Perceived Exertion (Exercise) 9    Perceived Dyspnea (Exercise) 0    Symptoms none    Comments results             Nutrition:  Target Goals: Understanding of nutrition guidelines, daily intake of sodium 1500mg , cholesterol 200mg , calories 30% from fat and 7% or less from  saturated fats, daily to have 5 or more servings of fruits and vegetables.  Education: All About Nutrition: -Group instruction provided by verbal, written material, interactive activities, discussions, models, and posters to present general guidelines for heart healthy nutrition including fat, fiber, MyPlate, the role of sodium in heart healthy nutrition, utilization of the nutrition label, and utilization of this knowledge for meal planning. Follow up email sent as well. Written material given at graduation. Flowsheet Row Cardiac Rehab from 07/03/2023 in [facility] Cardiac and Pulmonary Rehab  Education need identified 07/03/23       Biometrics:  Pre Biometrics - 07/03/23 1609       Pre Biometrics   Height 5' 3.58" (1.615 m)    Weight 156 lb 12.8 oz (71.1 kg)    Waist Circumference 38.5  inches    Hip Circumference 36 inches    Waist to Hip Ratio 1.07 %    BMI (Calculated) 27.27    Single Leg Stand 30 seconds              Nutrition Therapy Plan and Nutrition Goals:   Nutrition Assessments:  MEDIFICTS Score Key: >=70 Need to make dietary changes  40-70 Heart Healthy Diet <= 40 Therapeutic Level Cholesterol Diet  Flowsheet Row Cardiac Rehab from 07/03/2023 in [facility] Cardiac and Pulmonary Rehab  Picture Your Plate Total Score on Admission 68      Picture Your Plate Scores: <16 Unhealthy dietary pattern with much room for improvement. 41-50 Dietary pattern unlikely to meet recommendations for good health and room for improvement. 51-60 More healthful dietary pattern, with some room for improvement.  >60 Healthy dietary pattern, although there may be some specific behaviors that could be improved.    Nutrition Goals Re-Evaluation:   Nutrition Goals Discharge (Final Nutrition Goals Re-Evaluation):   Psychosocial: Target Goals: Acknowledge presence or absence of significant depression and/or stress, maximize coping skills, provide positive support system. Participant is  able to verbalize types and ability to use techniques and skills needed for reducing stress and depression.   Education: Stress, Anxiety, and Depression - Group verbal and visual presentation to define topics covered.  Reviews how body is impacted by stress, anxiety, and depression.  Also discusses healthy ways to reduce stress and to treat/manage anxiety and depression.  Written material given at graduation.   Education: Sleep Hygiene -Provides group verbal and written instruction about how sleep can affect your health.  Define sleep hygiene, discuss sleep cycles and impact of sleep habits. Review good sleep hygiene tips.    Initial Review & Psychosocial Screening:  Initial Psych Review & Screening - 07/01/23 1114       Initial Review   Current issues with Current Psychotropic Meds;Current Anxiety/Panic;Current Depression      Family Dynamics   Good Support System? Yes    Comments His depression is just "there" and worries about everything. He lives his wife that is a great support system. His daughters and his therapist are good supporters also.      Barriers   Psychosocial barriers to participate in program The patient should benefit from training in stress management and relaxation.      Screening Interventions   Interventions Encouraged to exercise;To provide support and resources with identified psychosocial needs;Provide feedback about the scores to participant    Expected Outcomes Short Term goal: Utilizing psychosocial counselor, staff and physician to assist with identification of specific Stressors or current issues interfering with healing process. Setting desired goal for each stressor or current issue identified.;Long Term Goal: Stressors or current issues are controlled or eliminated.;Long Term goal: The participant improves quality of Life and PHQ9 Scores as seen by post scores and/or verbalization of changes;Short Term goal: Identification and review with participant of any  Quality of Life or Depression concerns found by scoring the questionnaire.             Quality of Life Scores:   Quality of Life - 07/03/23 1604       Quality of Life   Select Quality of Life      Quality of Life Scores   Health/Function Pre 25.6 %    Socioeconomic Pre 22.75 %    Psych/Spiritual Pre 24.21 %    Family Pre 27.6 %    GLOBAL Pre 24.96 %  Scores of 19 and below usually indicate a poorer quality of life in these areas.  A difference of  2-3 points is a clinically meaningful difference.  A difference of 2-3 points in the total score of the Quality of Life Index has been associated with significant improvement in overall quality of life, self-image, physical symptoms, and general health in studies assessing change in quality of life.  PHQ-9: Review Flowsheet       07/03/2023  Depression screen PHQ 2/9  Decreased Interest 0  Down, Depressed, Hopeless 1  PHQ - 2 Score 1  Altered sleeping 0  Tired, decreased energy 1  Change in appetite 0  Feeling bad or failure about yourself  0  Trouble concentrating 0  Moving slowly or fidgety/restless 1  Suicidal thoughts 0  PHQ-9 Score 3  Difficult doing work/chores Not difficult at all   Interpretation of Total Score  Total Score Depression Severity:  1-4 = Minimal depression, 5-9 = Mild depression, 10-14 = Moderate depression, 15-19 = Moderately severe depression, 20-27 = Severe depression   Psychosocial Evaluation and Intervention:  Psychosocial Evaluation - 07/01/23 1116       Psychosocial Evaluation & Interventions   Interventions Relaxation education;Stress management education;Encouraged to exercise with the program and follow exercise prescription    Comments His depression is just "there" and worries about everything. He lives his wife that is a great support system. His daughters and his therapist are good supporters also.    Expected Outcomes Short: Start Heart[facility] to help with mood. Long:  Maintain a healthy mental state    Continue Psychosocial Services  Follow up required by staff             Psychosocial Re-Evaluation:   Psychosocial Discharge (Final Psychosocial Re-Evaluation):   Vocational Rehabilitation: Provide vocational rehab assistance to qualifying candidates.   Vocational Rehab Evaluation & Intervention:   Education: Education Goals: Education classes will be provided on a variety of topics geared toward better understanding of heart health and risk factor modification. Participant will state understanding/return demonstration of topics presented as noted by education test scores.  Learning Barriers/Preferences:  Learning Barriers/Preferences - 07/01/23 1113       Learning Barriers/Preferences   Learning Barriers None    Learning Preferences None             General Cardiac Education Topics:  AED/CPR: - Group verbal and written instruction with the use of models to demonstrate the basic use of the AED with the basic ABC's of resuscitation.   Anatomy and Cardiac Procedures: - Group verbal and visual presentation and models provide information about basic cardiac anatomy and function. Reviews the testing methods done to diagnose heart disease and the outcomes of the test results. Describes the treatment choices: Medical Management, Angioplasty, or Coronary Bypass Surgery for treating various heart conditions including Myocardial Infarction, Angina, Valve Disease, and Cardiac Arrhythmias.  Written material given at graduation. Flowsheet Row Cardiac Rehab from 07/03/2023 in [facility] Cardiac and Pulmonary Rehab  Education need identified 07/03/23       Medication Safety: - Group verbal and visual instruction to review commonly prescribed medications for heart and lung disease. Reviews the medication, class of the drug, and side effects. Includes the steps to properly store meds and maintain the prescription regimen.  Written material given at  graduation.   Intimacy: - Group verbal instruction through game format to discuss how heart and lung disease can affect sexual intimacy. Written material given at graduation.Marland Kitchen   Know  Your Numbers and Heart Failure: - Group verbal and visual instruction to discuss disease risk factors for cardiac and pulmonary disease and treatment options.  Reviews associated critical values for Overweight/Obesity, Hypertension, Cholesterol, and Diabetes.  Discusses basics of heart failure: signs/symptoms and treatments.  Introduces Heart Failure Zone chart for action plan for heart failure.  Written material given at graduation.   Infection Prevention: - Provides verbal and written material to individual with discussion of infection control including proper hand washing and proper equipment cleaning during exercise session. Flowsheet Row Cardiac Rehab from 07/03/2023 in [facility] Cardiac and Pulmonary Rehab  Date 07/03/23  Educator [facility] -   Instruction Review Code 1- Verbalizes Understanding       Falls Prevention: - Provides verbal and written material to individual with discussion of falls prevention and safety. Flowsheet Row Cardiac Rehab from 07/03/2023 in [facility] Cardiac and Pulmonary Rehab  Date 07/03/23  Educator [facility]  Instruction Review Code 1- Verbalizes Understanding       Other: -Provides group and verbal instruction on various topics (see comments)   Knowledge Questionnaire Score:  Knowledge Questionnaire Score - 07/03/23 1606       Knowledge Questionnaire Score   Pre Score 22             Core Components/Risk Factors/Patient Goals at Admission:  Personal Goals and Risk Factors at Admission - 07/03/23 1606       Core Components/Risk Factors/Patient Goals on Admission    Weight Management Yes;Weight Loss    Intervention Weight Management: Develop a combined nutrition and exercise program designed to reach desired caloric intake, while maintaining appropriate intake of nutrient and  fiber, sodium and fats, and appropriate energy expenditure required for the weight goal.;Weight Management: Provide education and appropriate resources to help participant work on and attain dietary goals.;Weight Management/Obesity: Establish reasonable short term and long term weight goals.    Admit Weight 156 lb 12.8 oz (71.1 kg)    Goal Weight: Short Term 150 lb (68 kg)    Goal Weight: Long Term 140 lb (63.5 kg)    Expected Outcomes Short Term: Continue to assess and modify interventions until short term weight is achieved;Long Term: Adherence to nutrition and physical activity/exercise program aimed toward attainment of established weight goal;Weight Loss: Understanding of general recommendations for a balanced deficit meal plan, which promotes 1-2 lb weight loss per week and includes a negative energy balance of [phone] kcal/d;Understanding recommendations for meals to include 15-35% energy as protein, 25-35% energy from fat, 35-60% energy from carbohydrates, less than 200mg  of dietary cholesterol, 20-35 gm of total fiber daily;Understanding of distribution of calorie intake throughout the day with the consumption of 4-5 meals/snacks    Diabetes Yes    Intervention Provide education about signs/symptoms and action to take for hypo/hyperglycemia.;Provide education about proper nutrition, including hydration, and aerobic/resistive exercise prescription along with prescribed medications to achieve blood glucose in normal ranges: Fasting glucose 65-99 mg/dL    Expected Outcomes Long Term: Attainment of HbA1C < 7%.;Short Term: Participant verbalizes understanding of the signs/symptoms and immediate care of hyper/hypoglycemia, proper foot care and importance of medication, aerobic/resistive exercise and nutrition plan for blood glucose control.    Hypertension Yes    Intervention Provide education on lifestyle modifcations including regular physical activity/exercise, weight management, moderate sodium  restriction and increased consumption of fresh fruit, vegetables, and low fat dairy, alcohol moderation, and smoking cessation.;Monitor prescription use compliance.    Expected Outcomes Short Term: Continued assessment and intervention until BP is < 140/84mm  HG in hypertensive participants. < 130/43mm HG in hypertensive participants with diabetes, heart failure or chronic kidney disease.;Long Term: Maintenance of blood pressure at goal levels.    Lipids Yes    Intervention Provide education and support for participant on nutrition & aerobic/resistive exercise along with prescribed medications to achieve LDL 70mg , HDL >40mg .    Expected Outcomes Short Term: Participant states understanding of desired cholesterol values and is compliant with medications prescribed. Participant is following exercise prescription and nutrition guidelines.;Long Term: Cholesterol controlled with medications as prescribed, with individualized exercise RX and with personalized nutrition plan. Value goals: LDL < 70mg , HDL > 40 mg.             Education:Diabetes - Individual verbal and written instruction to review signs/symptoms of diabetes, desired ranges of glucose level fasting, after meals and with exercise. Acknowledge that pre and post exercise glucose checks will be done for 3 sessions at entry of program. Flowsheet Row Cardiac Rehab from 07/03/2023 in [facility] Cardiac and Pulmonary Rehab  Date 07/03/23  Educator [facility]  Instruction Review Code 1- Verbalizes Understanding       Core Components/Risk Factors/Patient Goals Review:    Core Components/Risk Factors/Patient Goals at Discharge (Final Review):    ITP Comments:  ITP Comments     Row Name 07/01/23 1112 07/03/23 1559 07/10/23 1155       ITP Comments Virtual Visit completed. Patient informed on EP and RD appointment and 6 Minute walk test. Patient also informed of patient health questionnaires on My Chart. Patient Verbalizes understanding. Visit  diagnosis can be found in [facility] 06/19/2023. Completed and gym orientation. Initial ITP created and sent for review to Dr. Bethann Punches, Medical Director. 30 Day review completed. Medical Director ITP review done, changes made as directed, and signed approval by Medical Director.    new to program              Comments:

## 2023-07-12 ENCOUNTER — Encounter: Payer: BC Managed Care – PPO | Admitting: *Deleted

## 2023-07-12 DIAGNOSIS — Z48812 Encounter for surgical aftercare following surgery on the circulatory system: Secondary | ICD-10-CM | POA: Diagnosis not present

## 2023-07-12 DIAGNOSIS — Z955 Presence of coronary angioplasty implant and graft: Secondary | ICD-10-CM

## 2023-07-12 LAB — GLUCOSE, CAPILLARY
Glucose-Capillary: 127 mg/dL — ABNORMAL HIGH (ref 70–99)
Glucose-Capillary: 147 mg/dL — ABNORMAL HIGH (ref 70–99)

## 2023-07-12 NOTE — Progress Notes (Signed)
Daily Session Note  Patient Details  Name: Danny Brown MRN: [id number] Date of Birth: [birth year] Referring Provider:   Flowsheet Row Cardiac Rehab from 07/03/2023 in [facility] Cardiac and Pulmonary Rehab  Referring Provider Dr. Dorothyann Peng       Encounter Date: 07/12/2023  Check In:  Session Check In - 07/12/23 1128       Check-In   Supervising physician immediately available to respond to emergencies See telemetry face sheet for immediately available ER MD    Location [facility]-Cardiac & Pulmonary Rehab    Staff Present Cora Collum, RN, BSN, CCRP;Joseph [place], [place];Betsy Coder, PhD, RN, CNS, CEN    Virtual Visit No    Medication changes reported     No    Fall or balance concerns reported    No    Warm-up and Cool-down Performed on first and last piece of equipment    Resistance Training Performed Yes    VAD Patient? No    PAD/SET Patient? No      Pain Assessment   Currently in Pain? No/denies                Social History   Tobacco Use  Smoking Status Former   Current packs/day: 1.00   Types: Cigarettes  Smokeless Tobacco Never  Tobacco Comments   Smoked for about 30 years    Goals Met:  Independence with exercise equipment Exercise tolerated well No report of concerns or symptoms today  Goals Unmet:  Not Applicable  Comments: Pt able to follow exercise prescription today without complaint.  Will continue to monitor for progression.    Dr. Bethann Punches is Medical Director for [facility] Cardiac Rehabilitation.  Dr. Vida Rigger is Medical Director for [facility] Pulmonary Rehabilitation.

## 2023-07-15 ENCOUNTER — Encounter: Payer: BC Managed Care – PPO | Admitting: *Deleted

## 2023-07-15 DIAGNOSIS — Z955 Presence of coronary angioplasty implant and graft: Secondary | ICD-10-CM

## 2023-07-15 DIAGNOSIS — Z48812 Encounter for surgical aftercare following surgery on the circulatory system: Secondary | ICD-10-CM | POA: Diagnosis not present

## 2023-07-15 LAB — GLUCOSE, CAPILLARY
Glucose-Capillary: 129 mg/dL — ABNORMAL HIGH (ref 70–99)
Glucose-Capillary: 108 mg/dL — ABNORMAL HIGH (ref 70–99)

## 2023-07-15 NOTE — Progress Notes (Signed)
Daily Session Note  Patient Details  Name: Danny Brown MRN: [id number] Date of Birth: February 11, 1956 Referring Provider:   Flowsheet Row Cardiac Rehab from 07/03/2023 in [facility] Cardiac and Pulmonary Rehab  Referring Provider Dr. Dorothyann Peng       Encounter Date: 07/15/2023  Check In:  Session Check In - 07/15/23 1117       Check-In   Supervising physician immediately available to respond to emergencies See telemetry face sheet for immediately available ER MD    Location [facility]-Cardiac & Pulmonary Rehab    Staff Present Cora Collum, RN, BSN, CCRP;Margaret Best, MS, Exercise Physiologist;Megan Katrinka Blazing, RN, ADN;Kristen Coble, RN,BC,MSN    Virtual Visit No    Medication changes reported     No    Fall or balance concerns reported    No    Warm-up and Cool-down Performed on first and last piece of equipment    Resistance Training Performed Yes    VAD Patient? No    PAD/SET Patient? No      Pain Assessment   Currently in Pain? No/denies                Social History   Tobacco Use  Smoking Status Former   Current packs/day: 1.00   Types: Cigarettes  Smokeless Tobacco Never  Tobacco Comments   Smoked for about 30 years    Goals Met:  Independence with exercise equipment Exercise tolerated well No report of concerns or symptoms today  Goals Unmet:  Not Applicable  Comments: Pt able to follow exercise prescription today without complaint.  Will continue to monitor for progression.    Dr. Bethann Punches is Medical Director for [facility] Cardiac Rehabilitation.  Dr. Vida Rigger is Medical Director for [facility] Pulmonary Rehabilitation.

## 2023-07-19 ENCOUNTER — Encounter: Payer: BC Managed Care – PPO | Admitting: *Deleted

## 2023-07-19 DIAGNOSIS — Z955 Presence of coronary angioplasty implant and graft: Secondary | ICD-10-CM

## 2023-07-19 DIAGNOSIS — Z48812 Encounter for surgical aftercare following surgery on the circulatory system: Secondary | ICD-10-CM | POA: Diagnosis not present

## 2023-07-19 NOTE — Progress Notes (Signed)
Daily Session Note  Patient Details  Name: Danny Brown MRN: [id number] Date of Birth: 1956-06-29 Referring Provider:   Flowsheet Row Cardiac Rehab from 07/03/2023 in [facility] Cardiac and Pulmonary Rehab  Referring Provider Dr. Dorothyann Peng       Encounter Date: 07/19/2023  Check In:  Session Check In - 07/19/23 1128       Check-In   Supervising physician immediately available to respond to emergencies See telemetry face sheet for immediately available ER MD    Location [facility]-Cardiac & Pulmonary Rehab    Staff Present Cora Collum, RN, BSN, CCRP;Joseph Hood, RCP,RRT,BSRT;Noah Tickle, [place], Exercise Physiologist    Virtual Visit No    Medication changes reported     No    Fall or balance concerns reported    No    Warm-up and Cool-down Performed on first and last piece of equipment    Resistance Training Performed Yes    VAD Patient? No    PAD/SET Patient? No      Pain Assessment   Currently in Pain? No/denies                Social History   Tobacco Use  Smoking Status Former   Current packs/day: 1.00   Types: Cigarettes  Smokeless Tobacco Never  Tobacco Comments   Smoked for about 30 years    Goals Met:  Independence with exercise equipment Exercise tolerated well No report of concerns or symptoms today  Goals Unmet:  Not Applicable  Comments: Pt able to follow exercise prescription today without complaint.  Will continue to monitor for progression.    Dr. Bethann Punches is Medical Director for [facility] Cardiac Rehabilitation.  Dr. Vida Rigger is Medical Director for [facility] Pulmonary Rehabilitation.

## 2023-07-22 ENCOUNTER — Encounter: Payer: BC Managed Care – PPO | Admitting: *Deleted

## 2023-07-22 DIAGNOSIS — Z48812 Encounter for surgical aftercare following surgery on the circulatory system: Secondary | ICD-10-CM | POA: Diagnosis not present

## 2023-07-22 DIAGNOSIS — Z955 Presence of coronary angioplasty implant and graft: Secondary | ICD-10-CM

## 2023-07-22 NOTE — Progress Notes (Signed)
Daily Session Note  Patient Details  Name: Danny Brown MRN: [id number] Date of Birth: 29-Jan-1956 Referring Provider:   Flowsheet Row Cardiac Rehab from 07/03/2023 in [facility] Cardiac and Pulmonary Rehab  Referring Provider Dr. Dorothyann Peng       Encounter Date: 07/22/2023  Check In:  Session Check In - 07/22/23 1112       Check-In   Supervising physician immediately available to respond to emergencies See telemetry face sheet for immediately available ER MD    Location [facility]-Cardiac & Pulmonary Rehab    Staff Present Cora Collum, RN, BSN, CCRP;Meredith Jewel Baize, RN Atilano Median, RN, Silas Flood, BS, Exercise Physiologist    Virtual Visit No    Medication changes reported     No    Fall or balance concerns reported    No    Warm-up and Cool-down Performed on first and last piece of equipment    Resistance Training Performed Yes    VAD Patient? No    PAD/SET Patient? No      Pain Assessment   Currently in Pain? No/denies                Social History   Tobacco Use  Smoking Status Former   Current packs/day: 1.00   Types: Cigarettes  Smokeless Tobacco Never  Tobacco Comments   Smoked for about 30 years    Goals Met:  Independence with exercise equipment Exercise tolerated well No report of concerns or symptoms today Strength training completed today  Goals Unmet:  Not Applicable  Comments: Pt able to follow exercise prescription today without complaint.  Will continue to monitor for progression.    Dr. Bethann Punches is Medical Director for [facility] Cardiac Rehabilitation.  Dr. Vida Rigger is Medical Director for [facility] Pulmonary Rehabilitation.

## 2023-07-26 ENCOUNTER — Encounter: Payer: BC Managed Care – PPO | Attending: Internal Medicine | Admitting: *Deleted

## 2023-07-26 DIAGNOSIS — Z955 Presence of coronary angioplasty implant and graft: Secondary | ICD-10-CM | POA: Insufficient documentation

## 2023-07-26 DIAGNOSIS — Z48812 Encounter for surgical aftercare following surgery on the circulatory system: Secondary | ICD-10-CM | POA: Insufficient documentation

## 2023-07-26 NOTE — Progress Notes (Signed)
 Daily Session Note  Patient Details  Name: Danny Brown MRN: [id number] Date of Birth: 01/04/1956 Referring Provider:   Flowsheet Row Cardiac Rehab from 07/03/2023 in [facility] Cardiac and Pulmonary Rehab  Referring Provider Dr. Cara Lovelace       Encounter Date: 07/26/2023  Check In:  Session Check In - 07/26/23 1135       Check-In   Supervising physician immediately available to respond to emergencies See telemetry face sheet for immediately available ER MD    Location [facility]-Cardiac & Pulmonary Rehab    Staff Present Othel Durand, RN, BSN, CCRP;Noah Tickle, BS, Exercise Physiologist;Maxon Conetta BS, , Exercise Physiologist    Virtual Visit No    Medication changes reported     No    Fall or balance concerns reported    No    Warm-up and Cool-down Performed on first and last piece of equipment    Resistance Training Performed Yes    VAD Patient? No    PAD/SET Patient? No      Pain Assessment   Currently in Pain? No/denies                Social History   Tobacco Use  Smoking Status Former   Current packs/day: 1.00   Types: Cigarettes  Smokeless Tobacco Never  Tobacco Comments   Smoked for about 30 years    Goals Met:  Independence with exercise equipment Exercise tolerated well No report of concerns or symptoms today  Goals Unmet:  Not Applicable  Comments: Pt able to follow exercise prescription today without complaint.  Will continue to monitor for progression.    Dr. Oneil Pinal is Medical Director for [facility] Cardiac Rehabilitation.  Dr. Fuad Aleskerov is Medical Director for [facility] Pulmonary Rehabilitation.

## 2023-07-28 ENCOUNTER — Other Ambulatory Visit: Payer: Self-pay | Admitting: Urology

## 2023-07-29 ENCOUNTER — Encounter: Payer: BC Managed Care – PPO | Admitting: *Deleted

## 2023-07-29 DIAGNOSIS — Z955 Presence of coronary angioplasty implant and graft: Secondary | ICD-10-CM | POA: Diagnosis not present

## 2023-07-29 NOTE — Progress Notes (Signed)
 Daily Session Note  Patient Details  Name: Danny Brown MRN: [id number] Date of Birth: 08-04-1955 Referring Provider:   Flowsheet Row Cardiac Rehab from 07/03/2023 in [facility] Cardiac and Pulmonary Rehab  Referring Provider Dr. Cara Lovelace       Encounter Date: 07/29/2023  Check In:  Session Check In - 07/29/23 1120       Check-In   Supervising physician immediately available to respond to emergencies See telemetry face sheet for immediately available ER MD    Location [facility]-Cardiac & Pulmonary Rehab    Staff Present Rollene Paterson, MS, Exercise Physiologist;Maxon Burnell HECKLE, , Exercise Physiologist;Kelly Dyane, BS, ACSM CEP, Exercise Physiologist;Marriana Hibberd Claudene, RN, ADN    Virtual Visit No    Medication changes reported     No    Fall or balance concerns reported    No    Warm-up and Cool-down Performed on first and last piece of equipment    Resistance Training Performed Yes    VAD Patient? No    PAD/SET Patient? No      Pain Assessment   Currently in Pain? No/denies                Social History   Tobacco Use  Smoking Status Former   Current packs/day: 1.00   Types: Cigarettes  Smokeless Tobacco Never  Tobacco Comments   Smoked for about 30 years    Goals Met:  Independence with exercise equipment Exercise tolerated well No report of concerns or symptoms today Strength training completed today  Goals Unmet:  Not Applicable  Comments: Pt able to follow exercise prescription today without complaint.  Will continue to monitor for progression.    Dr. Oneil Pinal is Medical Director for [facility] Cardiac Rehabilitation.  Dr. Fuad Aleskerov is Medical Director for [facility] Pulmonary Rehabilitation.

## 2023-07-31 ENCOUNTER — Encounter: Payer: BC Managed Care – PPO | Admitting: *Deleted

## 2023-07-31 DIAGNOSIS — Z955 Presence of coronary angioplasty implant and graft: Secondary | ICD-10-CM

## 2023-07-31 NOTE — Progress Notes (Signed)
 Daily Session Note  Patient Details  Name: Danny Brown MRN: [id number] Date of Birth: [birth year] Referring Provider:   Flowsheet Row Cardiac Rehab from 07/03/2023 in [facility] Cardiac and Pulmonary Rehab  Referring Provider Dr. Cara Lovelace       Encounter Date: 07/31/2023  Check In:  Session Check In - 07/31/23 1112       Check-In   Supervising physician immediately available to respond to emergencies See telemetry face sheet for immediately available ER MD    Location [facility]-Cardiac & Pulmonary Rehab    Staff Present Othel Durand, RN, BSN, CCRP;Joseph Tribbey, RCP,RRT,BSRT;Kelly Dryden, BS, ACSM CEP, Exercise Physiologist;Kweli Grassel Claudene, RN, [place]    Virtual Visit No    Medication changes reported     No    Fall or balance concerns reported    No    Warm-up and Cool-down Performed on first and last piece of equipment    Resistance Training Performed Yes    VAD Patient? No    PAD/SET Patient? No      Pain Assessment   Currently in Pain? No/denies                Social History   Tobacco Use  Smoking Status Former   Current packs/day: 1.00   Types: Cigarettes  Smokeless Tobacco Never  Tobacco Comments   Smoked for about 30 years    Goals Met:  Independence with exercise equipment Exercise tolerated well No report of concerns or symptoms today Strength training completed today  Goals Unmet:  Not Applicable  Comments: Pt able to follow exercise prescription today without complaint.  Will continue to monitor for progression.    Dr. Oneil Pinal is Medical Director for [facility] Cardiac Rehabilitation.  Dr. Fuad Aleskerov is Medical Director for [facility] Pulmonary Rehabilitation.

## 2023-08-02 ENCOUNTER — Encounter: Payer: BC Managed Care – PPO | Admitting: *Deleted

## 2023-08-02 DIAGNOSIS — Z955 Presence of coronary angioplasty implant and graft: Secondary | ICD-10-CM | POA: Diagnosis not present

## 2023-08-02 NOTE — Progress Notes (Signed)
 Daily Session Note  Patient Details  Name: Danny Brown MRN: [id number] Date of Birth: 1955-09-09 Referring Provider:   Flowsheet Row Cardiac Rehab from 07/03/2023 in [facility] Cardiac and Pulmonary Rehab  Referring Provider Dr. Cara Lovelace       Encounter Date: 08/02/2023  Check In:  Session Check In - 08/02/23 1128       Check-In   Supervising physician immediately available to respond to emergencies See telemetry face sheet for immediately available ER MD    Location [facility]-Cardiac & Pulmonary Rehab    Staff Present Othel Durand, RN, BSN, CCRP;Noah Tickle, BS, Exercise Physiologist;Joseph [place], [place]    Virtual Visit No    Medication changes reported     No    Fall or balance concerns reported    No    Warm-up and Cool-down Performed on first and last piece of equipment    Resistance Training Performed Yes    VAD Patient? No    PAD/SET Patient? No      Pain Assessment   Currently in Pain? No/denies                Social History   Tobacco Use  Smoking Status Former   Current packs/day: 1.00   Types: Cigarettes  Smokeless Tobacco Never  Tobacco Comments   Smoked for about 30 years    Goals Met:  Independence with exercise equipment Exercise tolerated well No report of concerns or symptoms today  Goals Unmet:  Not Applicable  Comments: Pt able to follow exercise prescription today without complaint.  Will continue to monitor for progression.    Dr. Oneil Pinal is Medical Director for [facility] Cardiac Rehabilitation.  Dr. Fuad Aleskerov is Medical Director for [facility] Pulmonary Rehabilitation.

## 2023-08-05 ENCOUNTER — Encounter: Payer: BC Managed Care – PPO | Admitting: *Deleted

## 2023-08-05 DIAGNOSIS — Z955 Presence of coronary angioplasty implant and graft: Secondary | ICD-10-CM

## 2023-08-05 NOTE — Progress Notes (Signed)
 Daily Session Note  Patient Details  Name: Danny Brown MRN: [id number] Date of Birth: 03/17/1956 Referring Provider:   Flowsheet Row Cardiac Rehab from 07/03/2023 in [facility] Cardiac and Pulmonary Rehab  Referring Provider Dr. Cara Lovelace       Encounter Date: 08/05/2023  Check In:  Session Check In - 08/05/23 1112       Check-In   Supervising physician immediately available to respond to emergencies See telemetry face sheet for immediately available ER MD    Location [facility]-Cardiac & Pulmonary Rehab    Staff Present Maxon Conetta BS, , Exercise Physiologist;[employer] Dyane, BS, ACSM CEP, Exercise Physiologist;Margaret Best, MS, Exercise Physiologist;Other   Burnard Davenport, RN   Virtual Visit No    Medication changes reported     No    Fall or balance concerns reported    No    Tobacco Cessation No Change    Warm-up and Cool-down Performed on first and last piece of equipment    Resistance Training Performed Yes    VAD Patient? No    PAD/SET Patient? No      Pain Assessment   Currently in Pain? No/denies                Social History   Tobacco Use  Smoking Status Former   Current packs/day: 1.00   Types: Cigarettes  Smokeless Tobacco Never  Tobacco Comments   Smoked for about 30 years    Goals Met:  Independence with exercise equipment Exercise tolerated well Personal goals reviewed No report of concerns or symptoms today Strength training completed today  Goals Unmet:  Not Applicable  Comments: Pt able to follow exercise prescription today without complaint.  Will continue to monitor for progression.    Dr. Oneil Pinal is Medical Director for [facility] Cardiac Rehabilitation.  Dr. Fuad Aleskerov is Medical Director for [facility] Pulmonary Rehabilitation.

## 2023-08-07 ENCOUNTER — Encounter: Payer: BC Managed Care – PPO | Admitting: *Deleted

## 2023-08-07 DIAGNOSIS — Z955 Presence of coronary angioplasty implant and graft: Secondary | ICD-10-CM

## 2023-08-07 NOTE — Progress Notes (Signed)
 Daily Session Note  Patient Details  Name: Danny Brown MRN: [id number] Date of Birth: 12-08-1955 Referring Provider:   Flowsheet Row Cardiac Rehab from 07/03/2023 in [facility] Cardiac and Pulmonary Rehab  Referring Provider Dr. Burney Carter       Encounter Date: 08/07/2023  Check In:  Session Check In - 08/07/23 1122       Check-In   Supervising physician immediately available to respond to emergencies See telemetry face sheet for immediately available ER MD    Location [facility]-Cardiac & Pulmonary Rehab    Staff Present Maud Sorenson, RN, BSN, CCRP;Meredith Manson Seitz RN,BSN;Joseph [place] RCP,RRT,BSRT    Virtual Visit No    Medication changes reported     No    Fall or balance concerns reported    No    Warm-up and Cool-down Performed on first and last piece of equipment    Resistance Training Performed Yes    VAD Patient? No    PAD/SET Patient? No      Pain Assessment   Currently in Pain? No/denies                Social History   Tobacco Use  Smoking Status Former   Current packs/day: 1.00   Types: Cigarettes  Smokeless Tobacco Never  Tobacco Comments   Smoked for about 30 years    Goals Met:  Independence with exercise equipment Exercise tolerated well No report of concerns or symptoms today  Goals Unmet:  Not Applicable  Comments: Pt able to follow exercise prescription today without complaint.  Will continue to monitor for progression.    Dr. Firman Hughes is Medical Director for [facility] Cardiac Rehabilitation.  Dr. Fuad Aleskerov is Medical Director for [facility] Pulmonary Rehabilitation.

## 2023-08-07 NOTE — Progress Notes (Signed)
 Cardiac Individual Treatment Plan  Patient Details  Name: Danny Brown MRN: [id number] Date of Birth: 12-Nov-1955 Referring Provider:   Flowsheet Row Cardiac Rehab from 07/03/2023 in [facility] Cardiac and Pulmonary Rehab  Referring Provider Dr. Burney Carter       Initial Encounter Date:  Flowsheet Row Cardiac Rehab from 07/03/2023 in [facility] Cardiac and Pulmonary Rehab  Date 07/03/23       Visit Diagnosis: Status post coronary artery stent placement  Patient's Home Medications on Admission:  Current Outpatient Medications:    acetaminophen  (TYLENOL ) 500 MG tablet, Take 1,000 mg by mouth every 6 (six) hours as needed for moderate pain (pain score 4-6)., Disp: , Rfl:    alfuzosin  (UROXATRAL ) 10 MG 24 hr tablet, TAKE ONE TABLET BY MOUTH ONE TIME DAILY WITH BREAKFAST, Disp: 30 tablet, Rfl: 0   aspirin  EC 81 MG tablet, Take 81 mg by mouth daily. Swallow whole., Disp: , Rfl:    atomoxetine (STRATTERA) 40 MG capsule, Take 40 mg by mouth every morning., Disp: , Rfl:    enalapril (VASOTEC) 2.5 MG tablet, Take 2.5 mg by mouth daily., Disp: , Rfl:    Enalapril-hydroCHLOROthiazide 5-12.5 MG tablet, SMARTSIG:1.0 Tablet(s) By Mouth Daily, Disp: , Rfl:    ezetimibe (ZETIA) 10 MG tablet, Take 10 mg by mouth daily., Disp: , Rfl:    L-METHYLFOLATE PO, Take 1 tablet by mouth daily., Disp: , Rfl:    metFORMIN (GLUCOPHAGE-XR) 750 MG 24 hr tablet, Take 750 mg by mouth 2 (two) times daily with a meal., Disp: , Rfl:    metoprolol  succinate (TOPROL -XL) 25 MG 24 hr tablet, Take 25 mg by mouth daily., Disp: , Rfl:    pravastatin (PRAVACHOL) 40 MG tablet, Take 40 mg by mouth at bedtime., Disp: , Rfl:    tadalafil  (CIALIS ) 20 MG tablet, Take 1 tablet (20 mg total) by mouth daily as needed for erectile dysfunction., Disp: 90 tablet, Rfl: 3   tadalafil  (CIALIS ) 5 MG tablet, Take 1 tablet (5 mg total) by mouth daily as needed for erectile dysfunction. (Patient not taking: Reported on 07/01/2023), Disp: 90 tablet, Rfl:  3   ticagrelor  (BRILINTA ) 90 MG TABS tablet, Take 1 tablet (90 mg total) by mouth 2 (two) times daily., Disp: 60 tablet, Rfl: 0   Vilazodone HCl (VIIBRYD) 40 MG TABS, Take 40 mg by mouth daily., Disp: , Rfl:   Past Medical History: Past Medical History:  Diagnosis Date   Diabetes mellitus without complication (HCC)    Dyspnea    GERD (gastroesophageal reflux disease)    Gilbert syndrome    Heart murmur    Hypercholesterolemia    Hyperlipidemia    Hypertension    Hypochloremia    Sleep apnea     Tobacco Use: Social History   Tobacco Use  Smoking Status Former   Current packs/day: 1.00   Types: Cigarettes  Smokeless Tobacco Never  Tobacco Comments   Smoked for about 30 years    Labs: Review Flowsheet        No data to display           Exercise Target Goals: Exercise Program Goal: Individual exercise prescription set using results from initial 6 min walk test and THRR while considering  patient's activity barriers and safety.   Exercise Prescription Goal: Initial exercise prescription builds to 30-45 minutes a day of aerobic activity, 2-3 days per week.  Home exercise guidelines will be given to patient during program as part of exercise prescription that the participant will  acknowledge.   Education: Aerobic Exercise: - Group verbal and visual presentation on the components of exercise prescription. Introduces F.I.T.T principle from ACSM for exercise prescriptions.  Reviews F.I.T.T. principles of aerobic exercise including progression. Written material given at graduation. Flowsheet Row Cardiac Rehab from 08/07/2023 in [facility] Cardiac and Pulmonary Rehab  Education need identified 07/03/23       Education: Resistance Exercise: - Group verbal and visual presentation on the components of exercise prescription. Introduces F.I.T.T principle from ACSM for exercise prescriptions  Reviews F.I.T.T. principles of resistance exercise including progression. Written material  given at graduation.    Education: Exercise & Equipment Safety: - Individual verbal instruction and demonstration of equipment use and safety with use of the equipment. Flowsheet Row Cardiac Rehab from 08/07/2023 in [facility] Cardiac and Pulmonary Rehab  Date 07/03/23  Educator [facility]  Instruction Review Code 1- Verbalizes Understanding       Education: Exercise Physiology & General Exercise Guidelines: - Group verbal and written instruction with models to review the exercise physiology of the cardiovascular system and associated critical values. Provides general exercise guidelines with specific guidelines to those with heart or lung disease.    Education: Flexibility, Balance, Mind/Body Relaxation: - Group verbal and visual presentation with interactive activity on the components of exercise prescription. Introduces F.I.T.T principle from ACSM for exercise prescriptions. Reviews F.I.T.T. principles of flexibility and balance exercise training including progression. Also discusses the mind body connection.  Reviews various relaxation techniques to help reduce and manage stress (i.e. Deep breathing, progressive muscle relaxation, and visualization). Balance handout provided to take home. Written material given at graduation.   Activity Barriers & Risk Stratification:  Activity Barriers & Cardiac Risk Stratification - 07/03/23 1601       Activity Barriers & Cardiac Risk Stratification   Cardiac Risk Stratification Moderate             6 Minute Walk:  6 Minute Walk     Row Name 07/03/23 1559         6 Minute Walk   Phase Initial     Distance 1335 feet     Walk Time 6 minutes     # of Rest Breaks 0     MPH 2.5     METS 2.9     RPE 9     Perceived Dyspnea  0     VO2 Peak 10.3     Symptoms No     Resting HR 74 bpm     Resting BP 94/56     Resting Oxygen Saturation  97 %     Exercise Oxygen Saturation  during 6 min walk 96 %     Max Ex. HR 99 bpm     Max Ex. BP 128/64     2  Minute Post BP 112/64              Oxygen Initial Assessment:   Oxygen Re-Evaluation:   Oxygen Discharge (Final Oxygen Re-Evaluation):   Initial Exercise Prescription:  Initial Exercise Prescription - 07/03/23 1600       Date of Initial Exercise RX and Referring Provider   Date 07/03/23    Referring Provider Dr. Burney Carter      Oxygen   Maintain Oxygen Saturation 88% or higher      Treadmill   MPH 2.3    Grade 1    Minutes 15    METs 3.08      Elliptical   Level 1    Speed  3    Minutes 15    METs 2.9      Biostep-RELP   Level 3    SPM 50    Minutes 15    METs 2.9      Intensity   THRR 40-80% of Max Heartrate 104-135    Ratings of Perceived Exertion 11-13    Perceived Dyspnea 0-4      Progression   Progression Continue to progress workloads to maintain intensity without signs/symptoms of physical distress.      Resistance Training   Training Prescription Yes    Weight 8lb    Reps 10-15             Perform Capillary Blood Glucose checks as needed.  Exercise Prescription Changes:   Exercise Prescription Changes     Row Name 07/03/23 1600 07/23/23 1400           Response to Exercise   Blood Pressure (Admit) 94/56 130/72      Blood Pressure (Exercise) 128/64 144/68      Blood Pressure (Exit) 112/64 116/62      Heart Rate (Admit) 74 bpm 110 bpm      Heart Rate (Exercise) 99 bpm 150 bpm      Heart Rate (Exit) 75 bpm 117 bpm      Oxygen Saturation (Admit) 97 % --      Oxygen Saturation (Exercise) 96 % --      Oxygen Saturation (Exit) 96 % --      Rating of Perceived Exertion (Exercise) 9 13      Perceived Dyspnea (Exercise) 0 --      Symptoms none none      Comments results First two weeks of exercise      Duration -- Continue with 30 min of aerobic exercise without signs/symptoms of physical distress.      Intensity -- THRR unchanged        Progression   Progression -- Continue to progress workloads to maintain intensity  without signs/symptoms of physical distress.      Average METs -- 3.26        Resistance Training   Training Prescription -- Yes      Weight -- 8lb      Reps -- 10-15        Interval Training   Interval Training -- No        Treadmill   MPH -- 3      Grade -- 2      Minutes -- 15      METs -- 4.12        Elliptical   Level -- 1      Speed -- 3      Minutes -- 15      METs -- 2.7        Oxygen   Maintain Oxygen Saturation -- 88% or higher               Exercise Comments:   Exercise Comments     Row Name 07/10/23 1725           Exercise Comments First full day of exercise!  Patient was oriented to gym and equipment including functions, settings, policies, and procedures.  Patient's individual exercise prescription and treatment plan were reviewed.  All starting workloads were established based on the results of the 6 minute walk test done at initial orientation visit.  The plan for exercise progression was also introduced and progression will be customized  based on patient's performance and goals.                Exercise Goals and Review:   Exercise Goals     Row Name 07/03/23 1609             Exercise Goals   Increase Physical Activity Yes       Intervention Develop an individualized exercise prescription for aerobic and resistive training based on initial evaluation findings, risk stratification, comorbidities and participant's personal goals.;Provide advice, education, support and counseling about physical activity/exercise needs.       Expected Outcomes Long Term: Exercising regularly at least 3-5 days a week.;Long Term: Add in home exercise to make exercise part of routine and to increase amount of physical activity.;Short Term: Attend rehab on a regular basis to increase amount of physical activity.       Increase Strength and Stamina Yes       Intervention Develop an individualized exercise prescription for aerobic and resistive training based on  initial evaluation findings, risk stratification, comorbidities and participant's personal goals.;Provide advice, education, support and counseling about physical activity/exercise needs.       Expected Outcomes Long Term: Improve cardiorespiratory fitness, muscular endurance and strength as measured by increased METs and functional capacity ( );Short Term: Perform resistance training exercises routinely during rehab and add in resistance training at home;Short Term: Increase workloads from initial exercise prescription for resistance, speed, and METs.       Able to understand and use rate of perceived exertion (RPE) scale Yes       Intervention Provide education and explanation on how to use RPE scale       Expected Outcomes Long Term:  Able to use RPE to guide intensity level when exercising independently;Short Term: Able to use RPE daily in rehab to express subjective intensity level       Able to understand and use Dyspnea scale Yes       Intervention Provide education and explanation on how to use Dyspnea scale       Expected Outcomes Long Term: Able to use Dyspnea scale to guide intensity level when exercising independently;Short Term: Able to use Dyspnea scale daily in rehab to express subjective sense of shortness of breath during exertion       Knowledge and understanding of Target Heart Rate Range (THRR) Yes       Intervention Provide education and explanation of THRR including how the numbers were predicted and where they are located for reference       Expected Outcomes Long Term: Able to use THRR to govern intensity when exercising independently;Short Term: Able to use daily as guideline for intensity in rehab;Short Term: Able to state/look up THRR       Able to check pulse independently Yes       Intervention Review the importance of being able to check your own pulse for safety during independent exercise;Provide education and demonstration on how to check pulse in carotid and radial  arteries.       Expected Outcomes Long Term: Able to check pulse independently and accurately;Short Term: Able to explain why pulse checking is important during independent exercise       Understanding of Exercise Prescription Yes       Intervention Provide education, explanation, and written materials on patient's individual exercise prescription       Expected Outcomes Long Term: Able to explain home exercise prescription to exercise independently;Short Term: Able to explain program exercise  prescription                Exercise Goals Re-Evaluation :  Exercise Goals Re-Evaluation     Row Name 07/10/23 1728 07/23/23 1408           Exercise Goal Re-Evaluation   Exercise Goals Review Knowledge and understanding of Target Heart Rate Range (THRR);Able to understand and use rate of perceived exertion (RPE) scale;Increase Physical Activity;Understanding of Exercise Prescription;Increase Strength and Stamina;Able to check pulse independently Increase Physical Activity;Increase Strength and Stamina;Understanding of Exercise Prescription      Comments Reviewed RPE and dyspnea scale, THR and program prescription with pt today.  Pt voiced understanding and was given a copy of goals to take home. Jarred is off to a good start in the program. He did well during his first two weeks of rehab, improving his treadmill workload to a speed of 3 mph and an incline of 2%. He also did well on the elliptical at level 1. We will continue to monitor his progress in the program.      Expected Outcomes Short: Use RPE daily to regulate intensity.  Long: Follow program prescription in THR. Short: Continue to follow current exercise prescription. Long: Continue exercise to improve strength and stamina.               Discharge Exercise Prescription (Final Exercise Prescription Changes):  Exercise Prescription Changes - 07/23/23 1400       Response to Exercise   Blood Pressure (Admit) 130/72    Blood Pressure  (Exercise) 144/68    Blood Pressure (Exit) 116/62    Heart Rate (Admit) 110 bpm    Heart Rate (Exercise) 150 bpm    Heart Rate (Exit) 117 bpm    Rating of Perceived Exertion (Exercise) 13    Symptoms none    Comments First two weeks of exercise    Duration Continue with 30 min of aerobic exercise without signs/symptoms of physical distress.    Intensity THRR unchanged      Progression   Progression Continue to progress workloads to maintain intensity without signs/symptoms of physical distress.    Average METs 3.26      Resistance Training   Training Prescription Yes    Weight 8lb    Reps 10-15      Interval Training   Interval Training No      Treadmill   MPH 3    Grade 2    Minutes 15    METs 4.12      Elliptical   Level 1    Speed 3    Minutes 15    METs 2.7      Oxygen   Maintain Oxygen Saturation 88% or higher             Nutrition:  Target Goals: Understanding of nutrition guidelines, daily intake of sodium 1500mg , cholesterol 200mg , calories 30% from fat and 7% or less from saturated fats, daily to have 5 or more servings of fruits and vegetables.  Education: All About Nutrition: -Group instruction provided by verbal, written material, interactive activities, discussions, models, and posters to present general guidelines for heart healthy nutrition including fat, fiber, MyPlate, the role of sodium in heart healthy nutrition, utilization of the nutrition label, and utilization of this knowledge for meal planning. Follow up email sent as well. Written material given at graduation. Flowsheet Row Cardiac Rehab from 08/07/2023 in [facility] Cardiac and Pulmonary Rehab  Education need identified 07/03/23  Date 08/07/23  Educator  JG  Instruction Review Code 1- Verbalizes Understanding       Biometrics:  Pre Biometrics - 07/03/23 1609       Pre Biometrics   Height 5' 3.58" (1.615 m)    Weight 156 lb 12.8 oz (71.1 kg)    Waist Circumference 38.5 inches     Hip Circumference 36 inches    Waist to Hip Ratio 1.07 %    BMI (Calculated) 27.27    Single Leg Stand 30 seconds              Nutrition Therapy Plan and Nutrition Goals:   Nutrition Assessments:  MEDIFICTS Score Key: >=70 Need to make dietary changes  40-70 Heart Healthy Diet <= 40 Therapeutic Level Cholesterol Diet  Flowsheet Row Cardiac Rehab from 07/03/2023 in [facility] Cardiac and Pulmonary Rehab  Picture Your Plate Total Score on Admission 68      Picture Your Plate Scores: <16 Unhealthy dietary pattern with much room for improvement. 41-50 Dietary pattern unlikely to meet recommendations for good health and room for improvement. 51-60 More healthful dietary pattern, with some room for improvement.  >60 Healthy dietary pattern, although there may be some specific behaviors that could be improved.    Nutrition Goals Re-Evaluation:  Nutrition Goals Re-Evaluation     Row Name 08/05/23 1130             Goals   Comment Patient deferred individual RD appointment but plans to come to group education classes this week and next week.                Nutrition Goals Discharge (Final Nutrition Goals Re-Evaluation):  Nutrition Goals Re-Evaluation - 08/05/23 1130       Goals   Comment Patient deferred individual RD appointment but plans to come to group education classes this week and next week.             Psychosocial: Target Goals: Acknowledge presence or absence of significant depression and/or stress, maximize coping skills, provide positive support system. Participant is able to verbalize types and ability to use techniques and skills needed for reducing stress and depression.   Education: Stress, Anxiety, and Depression - Group verbal and visual presentation to define topics covered.  Reviews how body is impacted by stress, anxiety, and depression.  Also discusses healthy ways to reduce stress and to treat/manage anxiety and depression.  Written material  given at graduation.   Education: Sleep Hygiene -Provides group verbal and written instruction about how sleep can affect your health.  Define sleep hygiene, discuss sleep cycles and impact of sleep habits. Review good sleep hygiene tips.    Initial Review & Psychosocial Screening:  Initial Psych Review & Screening - 07/01/23 1114       Initial Review   Current issues with Current Psychotropic Meds;Current Anxiety/Panic;Current Depression      Family Dynamics   Good Support System? Yes    Comments His depression is just "there" and worries about everything. He lives his wife that is a great support system. His daughters and his therapist are good supporters also.      Barriers   Psychosocial barriers to participate in program The patient should benefit from training in stress management and relaxation.      Screening Interventions   Interventions Encouraged to exercise;To provide support and resources with identified psychosocial needs;Provide feedback about the scores to participant    Expected Outcomes Short Term goal: Utilizing psychosocial counselor, staff and physician to assist with  identification of specific Stressors or current issues interfering with healing process. Setting desired goal for each stressor or current issue identified.;Long Term Goal: Stressors or current issues are controlled or eliminated.;Long Term goal: The participant improves quality of Life and PHQ9 Scores as seen by post scores and/or verbalization of changes;Short Term goal: Identification and review with participant of any Quality of Life or Depression concerns found by scoring the questionnaire.             Quality of Life Scores:   Quality of Life - 07/03/23 1604       Quality of Life   Select Quality of Life      Quality of Life Scores   Health/Function Pre 25.6 %    Socioeconomic Pre 22.75 %    Psych/Spiritual Pre 24.21 %    Family Pre 27.6 %    GLOBAL Pre 24.96 %             Scores of 19 and below usually indicate a poorer quality of life in these areas.  A difference of  2-3 points is a clinically meaningful difference.  A difference of 2-3 points in the total score of the Quality of Life Index has been associated with significant improvement in overall quality of life, self-image, physical symptoms, and general health in studies assessing change in quality of life.  PHQ-9: Review Flowsheet       07/03/2023  Depression screen PHQ 2/9  Decreased Interest 0  Down, Depressed, Hopeless 1  PHQ - 2 Score 1  Altered sleeping 0  Tired, decreased energy 1  Change in appetite 0  Feeling bad or failure about yourself  0  Trouble concentrating 0  Moving slowly or fidgety/restless 1  Suicidal thoughts 0  PHQ-9 Score 3  Difficult doing work/chores Not difficult at all   Interpretation of Total Score  Total Score Depression Severity:  1-4 = Minimal depression, 5-9 = Mild depression, 10-14 = Moderate depression, 15-19 = Moderately severe depression, 20-27 = Severe depression   Psychosocial Evaluation and Intervention:  Psychosocial Evaluation - 07/01/23 1116       Psychosocial Evaluation & Interventions   Interventions Relaxation education;Stress management education;Encouraged to exercise with the program and follow exercise prescription    Comments His depression is just "there" and worries about everything. He lives his wife that is a great support system. His daughters and his therapist are good supporters also.    Expected Outcomes Short: Start [facility] to help with mood. Long: Maintain a healthy mental state    Continue Psychosocial Services  Follow up required by staff             Psychosocial Re-Evaluation:  Psychosocial Re-Evaluation     Row Name 08/05/23 1135             Psychosocial Re-Evaluation   Current issues with Current Psychotropic Meds;Current Depression;Current Anxiety/Panic       Comments Short: patient reports no changes  or concern in mental health, sleep, or stress. He uses a CPAP to sleep, which helps with sleep quality. He continues to have a good supports system.       Expected Outcomes Short: continue to attend cardiac rehab consistently for mental health benefits of exercise. Long: maintain good and stable mental health habits.       Interventions Encouraged to attend Cardiac Rehabilitation for the exercise       Continue Psychosocial Services  Follow up required by staff  Psychosocial Discharge (Final Psychosocial Re-Evaluation):  Psychosocial Re-Evaluation - 08/05/23 1135       Psychosocial Re-Evaluation   Current issues with Current Psychotropic Meds;Current Depression;Current Anxiety/Panic    Comments Short: patient reports no changes or concern in mental health, sleep, or stress. He uses a CPAP to sleep, which helps with sleep quality. He continues to have a good supports system.    Expected Outcomes Short: continue to attend cardiac rehab consistently for mental health benefits of exercise. Long: maintain good and stable mental health habits.    Interventions Encouraged to attend Cardiac Rehabilitation for the exercise    Continue Psychosocial Services  Follow up required by staff             Vocational Rehabilitation: Provide vocational rehab assistance to qualifying candidates.   Vocational Rehab Evaluation & Intervention:   Education: Education Goals: Education classes will be provided on a variety of topics geared toward better understanding of heart health and risk factor modification. Participant will state understanding/return demonstration of topics presented as noted by education test scores.  Learning Barriers/Preferences:  Learning Barriers/Preferences - 07/01/23 1113       Learning Barriers/Preferences   Learning Barriers None    Learning Preferences None             General Cardiac Education Topics:  AED/CPR: - Group verbal and written  instruction with the use of models to demonstrate the basic use of the AED with the basic ABC's of resuscitation.   Anatomy and Cardiac Procedures: - Group verbal and visual presentation and models provide information about basic cardiac anatomy and function. Reviews the testing methods done to diagnose heart disease and the outcomes of the test results. Describes the treatment choices: Medical Management, Angioplasty, or Coronary Bypass Surgery for treating various heart conditions including Myocardial Infarction, Angina, Valve Disease, and Cardiac Arrhythmias.  Written material given at graduation. Flowsheet Row Cardiac Rehab from 08/07/2023 in [facility] Cardiac and Pulmonary Rehab  Education need identified 07/03/23       Medication Safety: - Group verbal and visual instruction to review commonly prescribed medications for heart and lung disease. Reviews the medication, class of the drug, and side effects. Includes the steps to properly store meds and maintain the prescription regimen.  Written material given at graduation.   Intimacy: - Group verbal instruction through game format to discuss how heart and lung disease can affect sexual intimacy. Written material given at graduation..   Know Your Numbers and Heart Failure: - Group verbal and visual instruction to discuss disease risk factors for cardiac and pulmonary disease and treatment options.  Reviews associated critical values for Overweight/Obesity, Hypertension, Cholesterol, and Diabetes.  Discusses basics of heart failure: signs/symptoms and treatments.  Introduces Heart Failure Zone chart for action plan for heart failure.  Written material given at graduation.   Infection Prevention: - Provides verbal and written material to individual with discussion of infection control including proper hand washing and proper equipment cleaning during exercise session. Flowsheet Row Cardiac Rehab from 08/07/2023 in [facility] Cardiac and Pulmonary Rehab   Date 07/03/23  Educator [facility]  Instruction Review Code 1- Verbalizes Understanding       Falls Prevention: - Provides verbal and written material to individual with discussion of falls prevention and safety. Flowsheet Row Cardiac Rehab from 08/07/2023 in [facility] Cardiac and Pulmonary Rehab  Date 07/03/23  Educator [facility]  Instruction Review Code 1- Verbalizes Understanding       Other: -Provides group and verbal instruction on various  topics (see comments)   Knowledge Questionnaire Score:  Knowledge Questionnaire Score - 07/03/23 1606       Knowledge Questionnaire Score   Pre Score 22             Core Components/Risk Factors/Patient Goals at Admission:  Personal Goals and Risk Factors at Admission - 07/03/23 1606       Core Components/Risk Factors/Patient Goals on Admission    Weight Management Yes;Weight Loss    Intervention Weight Management: Develop a combined nutrition and exercise program designed to reach desired caloric intake, while maintaining appropriate intake of nutrient and fiber, sodium and fats, and appropriate energy expenditure required for the weight goal.;Weight Management: Provide education and appropriate resources to help participant work on and attain dietary goals.;Weight Management/Obesity: Establish reasonable short term and long term weight goals.    Admit Weight 156 lb 12.8 oz (71.1 kg)    Goal Weight: Short Term 150 lb (68 kg)    Goal Weight: Long Term 140 lb (63.5 kg)    Expected Outcomes Short Term: Continue to assess and modify interventions until short term weight is achieved;Long Term: Adherence to nutrition and physical activity/exercise program aimed toward attainment of established weight goal;Weight Loss: Understanding of general recommendations for a balanced deficit meal plan, which promotes 1-2 lb weight loss per week and includes a negative energy balance of [phone] kcal/d;Understanding recommendations for meals to include 15-35% energy as  protein, 25-35% energy from fat, 35-60% energy from carbohydrates, less than 200mg  of dietary cholesterol, 20-35 gm of total fiber daily;Understanding of distribution of calorie intake throughout the day with the consumption of 4-5 meals/snacks    Diabetes Yes    Intervention Provide education about signs/symptoms and action to take for hypo/hyperglycemia.;Provide education about proper nutrition, including hydration, and aerobic/resistive exercise prescription along with prescribed medications to achieve blood glucose in normal ranges: Fasting glucose 65-99 mg/dL    Expected Outcomes Long Term: Attainment of HbA1C < 7%.;Short Term: Participant verbalizes understanding of the signs/symptoms and immediate care of hyper/hypoglycemia, proper foot care and importance of medication, aerobic/resistive exercise and nutrition plan for blood glucose control.    Hypertension Yes    Intervention Provide education on lifestyle modifcations including regular physical activity/exercise, weight management, moderate sodium restriction and increased consumption of fresh fruit, vegetables, and low fat dairy, alcohol moderation, and smoking cessation.;Monitor prescription use compliance.    Expected Outcomes Short Term: Continued assessment and intervention until BP is < 140/40mm HG in hypertensive participants. < 130/1mm HG in hypertensive participants with diabetes, heart failure or chronic kidney disease.;Long Term: Maintenance of blood pressure at goal levels.    Lipids Yes    Intervention Provide education and support for participant on nutrition & aerobic/resistive exercise along with prescribed medications to achieve LDL 70mg , HDL >40mg .    Expected Outcomes Short Term: Participant states understanding of desired cholesterol values and is compliant with medications prescribed. Participant is following exercise prescription and nutrition guidelines.;Long Term: Cholesterol controlled with medications as prescribed,  with individualized exercise RX and with personalized nutrition plan. Value goals: LDL < 70mg , HDL > 40 mg.             Education:Diabetes - Individual verbal and written instruction to review signs/symptoms of diabetes, desired ranges of glucose level fasting, after meals and with exercise. Acknowledge that pre and post exercise glucose checks will be done for 3 sessions at entry of program. Flowsheet Row Cardiac Rehab from 08/07/2023 in [facility] Cardiac and Pulmonary Rehab  Date 07/03/23  Educator [facility]  Instruction Review Code 1- Verbalizes Understanding       Core Components/Risk Factors/Patient Goals Review:   Goals and Risk Factor Review     Row Name 08/05/23 1131             Core Components/Risk Factors/Patient Goals Review   Personal Goals Review Weight Management/Obesity;Hypertension;Lipids;Diabetes       Review Patient reports that his weight has been steady. He also monitors blood pressure and blood sugar at home regularly. He reports is blood pressure is always good and his blood sugars are most of the time in acceptable ranges. He takes all his blood pressure, diabetes, and  cholesterol medication as prescribed and follows up with his doctor regularly for lab work to help manage his cardiac risk factors.       Expected Outcomes Short: continue to monitor blood pressure and blood sugars at home. Long: manage cardiac risk factors long term.                Core Components/Risk Factors/Patient Goals at Discharge (Final Review):   Goals and Risk Factor Review - 08/05/23 1131       Core Components/Risk Factors/Patient Goals Review   Personal Goals Review Weight Management/Obesity;Hypertension;Lipids;Diabetes    Review Patient reports that his weight has been steady. He also monitors blood pressure and blood sugar at home regularly. He reports is blood pressure is always good and his blood sugars are most of the time in acceptable ranges. He takes all his blood pressure,  diabetes, and  cholesterol medication as prescribed and follows up with his doctor regularly for lab work to help manage his cardiac risk factors.    Expected Outcomes Short: continue to monitor blood pressure and blood sugars at home. Long: manage cardiac risk factors long term.             ITP Comments:  ITP Comments     Row Name 07/01/23 1112 07/03/23 1559 07/10/23 1155 07/10/23 1724 08/07/23 1313   ITP Comments Virtual Visit completed. Patient informed on EP and RD appointment and 6 Minute walk test. Patient also informed of patient health questionnaires on My Chart. Patient Verbalizes understanding. Visit diagnosis can be found in [facility] 06/19/[facility]23. Completed and gym orientation. Initial ITP created and sent for review to Dr. Firman Hughes, Medical Director. 30 Day review completed. Medical Director ITP review done, changes made as directed, and signed approval by Medical Director.    new to program First full day of exercise!  Patient was oriented to gym and equipment including functions, settings, policies, and procedures.  Patient's individual exercise prescription and treatment plan were reviewed.  All starting workloads were established based on the results of the 6 minute walk test done at initial orientation visit.  The plan for exercise progression was also introduced and progression will be customized based on patient's performance and goals. 30 Day review completed. Medical Director ITP review done, changes made as directed, and signed approval by Medical Director.            Comments: 30 day review

## 2023-08-09 ENCOUNTER — Encounter: Payer: BC Managed Care – PPO | Admitting: *Deleted

## 2023-08-09 DIAGNOSIS — Z955 Presence of coronary angioplasty implant and graft: Secondary | ICD-10-CM | POA: Diagnosis not present

## 2023-08-09 NOTE — Progress Notes (Signed)
Daily Session Note  Patient Details  Name: Danny Brown MRN: [id number] Date of Birth: 09/05/1955 Referring Provider:   Flowsheet Row Cardiac Rehab from 07/03/2023 in [facility] Cardiac and Pulmonary Rehab  Referring Provider Dr. Dorothyann Peng       Encounter Date: 08/09/2023  Check In:  Session Check In - 08/09/23 1225       Check-In   Supervising physician immediately available to respond to emergencies See telemetry face sheet for immediately available ER MD    Location [facility]-Cardiac & Pulmonary Rehab    Staff Present Bess Kinds RN,BSN;Joseph Delfin Edis [place], [place], Exercise Physiologist    Virtual Visit No    Medication changes reported     No    Fall or balance concerns reported    No    Warm-up and Cool-down Performed on first and last piece of equipment    Resistance Training Performed Yes    VAD Patient? No    PAD/SET Patient? No      Pain Assessment   Currently in Pain? No/denies                Social History   Tobacco Use  Smoking Status Former   Current packs/day: 1.00   Types: Cigarettes  Smokeless Tobacco Never  Tobacco Comments   Smoked for about 30 years    Goals Met:  Independence with exercise equipment Exercise tolerated well No report of concerns or symptoms today Strength training completed today  Goals Unmet:  Not Applicable  Comments: Pt able to follow exercise prescription today without complaint.  Will continue to monitor for progression.    Dr. Bethann Punches is Medical Director for [facility] Cardiac Rehabilitation.  Dr. Vida Rigger is Medical Director for [facility] Pulmonary Rehabilitation.

## 2023-08-12 ENCOUNTER — Encounter: Payer: BC Managed Care – PPO | Admitting: *Deleted

## 2023-08-12 DIAGNOSIS — Z955 Presence of coronary angioplasty implant and graft: Secondary | ICD-10-CM

## 2023-08-12 NOTE — Progress Notes (Signed)
Daily Session Note  Patient Details  Name: Danny Brown MRN: [id number] Date of Birth: Jun 28, 1956 Referring Provider:   Flowsheet Row Cardiac Rehab from 07/03/2023 in [facility] Cardiac and Pulmonary Rehab  Referring Provider Dr. Dorothyann Peng       Encounter Date: 08/12/2023  Check In:  Session Check In - 08/12/23 1109       Check-In   Supervising physician immediately available to respond to emergencies See telemetry face sheet for immediately available ER MD    Location [facility]-Cardiac & Pulmonary Rehab    Staff Present Maxon Conetta BS, Exercise Physiologist;Kelly Cloretta Ned, ACSM CEP, Exercise Physiologist;Margaret Best, MS, Exercise Physiologist;Pearlie Nies Jewel Baize RN,BSN    Virtual Visit No    Medication changes reported     No    Fall or balance concerns reported    No    Warm-up and Cool-down Performed on first and last piece of equipment    Resistance Training Performed Yes    VAD Patient? No    PAD/SET Patient? No      Pain Assessment   Currently in Pain? No/denies                Social History   Tobacco Use  Smoking Status Former   Current packs/day: 1.00   Types: Cigarettes  Smokeless Tobacco Never  Tobacco Comments   Smoked for about 30 years    Goals Met:  Independence with exercise equipment Exercise tolerated well No report of concerns or symptoms today Strength training completed today  Goals Unmet:  Not Applicable  Comments: Pt able to follow exercise prescription today without complaint.  Will continue to monitor for progression.    Dr. Bethann Punches is Medical Director for [facility] Cardiac Rehabilitation.  Dr. Vida Rigger is Medical Director for [facility] Pulmonary Rehabilitation.

## 2023-08-14 ENCOUNTER — Encounter: Payer: BC Managed Care – PPO | Admitting: *Deleted

## 2023-08-14 DIAGNOSIS — Z955 Presence of coronary angioplasty implant and graft: Secondary | ICD-10-CM

## 2023-08-14 NOTE — Progress Notes (Signed)
Daily Session Note  Patient Details  Name: Danny Brown MRN: [id number] Date of Birth: [birth year] Referring Provider:   Flowsheet Row Cardiac Rehab from 07/03/2023 in [facility] Cardiac and Pulmonary Rehab  Referring Provider Dr. Dorothyann Peng       Encounter Date: 08/14/2023  Check In:  Session Check In - 08/14/23 1112       Check-In   Supervising physician immediately available to respond to emergencies See telemetry face sheet for immediately available ER MD    Location [facility]-Cardiac & Pulmonary Rehab    Staff Present Maxon Conetta BS, Exercise Physiologist;Noah Tickle, BS, Exercise Physiologist;Meredith Jewel Baize RN,BSN;Annalicia Renfrew, RN, BSN, CCRP    Virtual Visit No    Medication changes reported     No    Fall or balance concerns reported    No    Warm-up and Cool-down Performed on first and last piece of equipment    Resistance Training Performed Yes    VAD Patient? No    PAD/SET Patient? No      Pain Assessment   Currently in Pain? No/denies                Social History   Tobacco Use  Smoking Status Former   Current packs/day: 1.00   Types: Cigarettes  Smokeless Tobacco Never  Tobacco Comments   Smoked for about 30 years    Goals Met:  Independence with exercise equipment Exercise tolerated well No report of concerns or symptoms today  Goals Unmet:  Not Applicable  Comments: Pt able to follow exercise prescription today without complaint.  Will continue to monitor for progression.    Dr. Bethann Punches is Medical Director for [facility] Cardiac Rehabilitation.  Dr. Vida Rigger is Medical Director for [facility] Pulmonary Rehabilitation.

## 2023-08-16 ENCOUNTER — Encounter: Payer: BC Managed Care – PPO | Admitting: *Deleted

## 2023-08-16 DIAGNOSIS — Z955 Presence of coronary angioplasty implant and graft: Secondary | ICD-10-CM

## 2023-08-16 NOTE — Progress Notes (Signed)
Daily Session Note  Patient Details  Name: Danny Brown MRN: [id number] Date of Birth: [birth year] Referring Provider:   Flowsheet Row Cardiac Rehab from 07/03/2023 in [facility] Cardiac and Pulmonary Rehab  Referring Provider Dr. Dorothyann Peng       Encounter Date: 08/16/2023  Check In:  Session Check In - 08/16/23 1133       Check-In   Supervising physician immediately available to respond to emergencies See telemetry face sheet for immediately available ER MD    Location [facility]-Cardiac & Pulmonary Rehab    Staff Present Cora Collum, RN, BSN, CCRP;Joseph Hood RCP,RRT,BSRT;Noah Tickle, [place], Exercise Physiologist    Virtual Visit No    Medication changes reported     No    Fall or balance concerns reported    No    Warm-up and Cool-down Performed on first and last piece of equipment    Resistance Training Performed Yes    VAD Patient? No    PAD/SET Patient? No      Pain Assessment   Currently in Pain? No/denies                Social History   Tobacco Use  Smoking Status Former   Current packs/day: 1.00   Types: Cigarettes  Smokeless Tobacco Never  Tobacco Comments   Smoked for about 30 years    Goals Met:  Independence with exercise equipment Exercise tolerated well No report of concerns or symptoms today  Goals Unmet:  Not Applicable  Comments: Pt able to follow exercise prescription today without complaint.  Will continue to monitor for progression.    Dr. Bethann Punches is Medical Director for [facility] Cardiac Rehabilitation.  Dr. Vida Rigger is Medical Director for [facility] Pulmonary Rehabilitation.

## 2023-08-19 ENCOUNTER — Encounter: Payer: BC Managed Care – PPO | Admitting: *Deleted

## 2023-08-19 DIAGNOSIS — Z955 Presence of coronary angioplasty implant and graft: Secondary | ICD-10-CM | POA: Diagnosis not present

## 2023-08-19 NOTE — Progress Notes (Signed)
Daily Session Note  Patient Details  Name: Danny Brown MRN: [id number] Date of Birth: 01/07/1956 Referring Provider:   Flowsheet Row Cardiac Rehab from 07/03/2023 in [facility] Cardiac and Pulmonary Rehab  Referring Provider Dr. Dorothyann Peng       Encounter Date: 08/19/2023  Check In:  Session Check In - 08/19/23 1130       Check-In   Supervising physician immediately available to respond to emergencies See telemetry face sheet for immediately available ER MD    Location [facility]-Cardiac & Pulmonary Rehab    Staff Present Cora Collum, RN, BSN, CCRP;Margaret Best, MS, Exercise Physiologist;Maxon Conetta BS, Exercise Physiologist;Kelly Cloretta Ned, ACSM CEP, Exercise Physiologist    Virtual Visit No    Medication changes reported     No    Fall or balance concerns reported    No    Warm-up and Cool-down Performed on first and last piece of equipment    Resistance Training Performed Yes    VAD Patient? No    PAD/SET Patient? No      Pain Assessment   Currently in Pain? No/denies                Social History   Tobacco Use  Smoking Status Former   Current packs/day: 1.00   Types: Cigarettes  Smokeless Tobacco Never  Tobacco Comments   Smoked for about 30 years    Goals Met:  Independence with exercise equipment Exercise tolerated well No report of concerns or symptoms today  Goals Unmet:  Not Applicable  Comments: Pt able to follow exercise prescription today without complaint.  Will continue to monitor for progression.   Reviewed home exercise with pt today.  Pt plans to walk, use recumbent bike, and join the senior center for [facility].  Reviewed THR, pulse, RPE, sign and symptoms, pulse oximetery and when to call 911 or MD.  Also discussed weather considerations and indoor options.  Pt voiced understanding.   Dr. Bethann Punches is Medical Director for [facility] Cardiac Rehabilitation.  Dr. Vida Rigger is Medical Director for [facility] Pulmonary  Rehabilitation.

## 2023-08-21 DIAGNOSIS — Z955 Presence of coronary angioplasty implant and graft: Secondary | ICD-10-CM

## 2023-08-21 NOTE — Progress Notes (Signed)
Daily Session Note  Patient Details  Name: Danny Brown MRN: [id number] Date of Birth: [birth year] Referring Provider:   Flowsheet Row Cardiac Rehab from 07/03/2023 in [facility] Cardiac and Pulmonary Rehab  Referring Provider Dr. Dorothyann Peng       Encounter Date: 08/21/2023  Check In:  Session Check In - 08/21/23 1127       Check-In   Supervising physician immediately available to respond to emergencies See telemetry face sheet for immediately available ER MD    Location [facility]-Cardiac & Pulmonary Rehab    Staff Present Kelton Pillar RN,BSN,MPA;Margaret Best, MS, Exercise Physiologist;Meredith Jewel Baize RN,BSN;Joseph Reino Kent RCP,RRT,BSRT    Virtual Visit No    Medication changes reported     No    Fall or balance concerns reported    No    Tobacco Cessation No Change    Warm-up and Cool-down Performed on first and last piece of equipment    Resistance Training Performed Yes    VAD Patient? No    PAD/SET Patient? No      Pain Assessment   Currently in Pain? No/denies                Social History   Tobacco Use  Smoking Status Former   Current packs/day: 1.00   Types: Cigarettes  Smokeless Tobacco Never  Tobacco Comments   Smoked for about 30 years    Goals Met:  Independence with exercise equipment Exercise tolerated well No report of concerns or symptoms today Strength training completed today  Goals Unmet:  Not Applicable  Comments: Pt able to follow exercise prescription today without complaint.  Will continue to monitor for progression.    Dr. Bethann Punches is Medical Director for [facility] Cardiac Rehabilitation.  Dr. Vida Rigger is Medical Director for [facility] Pulmonary Rehabilitation.

## 2023-08-23 ENCOUNTER — Encounter: Payer: BC Managed Care – PPO | Admitting: *Deleted

## 2023-08-23 DIAGNOSIS — Z955 Presence of coronary angioplasty implant and graft: Secondary | ICD-10-CM | POA: Diagnosis not present

## 2023-08-23 NOTE — Progress Notes (Signed)
Daily Session Note  Patient Details  Name: Danny Brown MRN: [id number] Date of Birth: 02/24/1956 Referring Provider:   Flowsheet Row Cardiac Rehab from 07/03/2023 in [facility] Cardiac and Pulmonary Rehab  Referring Provider Dr. Dorothyann Peng       Encounter Date: 08/23/2023  Check In:  Session Check In - 08/23/23 1126       Check-In   Supervising physician immediately available to respond to emergencies See telemetry face sheet for immediately available ER MD    Location [facility]-Cardiac & Pulmonary Rehab    Staff Present Cora Collum, RN, BSN, CCRP;Joseph Hood RCP,RRT,BSRT;Noah Tickle, [place], Exercise Physiologist    Virtual Visit No    Medication changes reported     No    Fall or balance concerns reported    No    Warm-up and Cool-down Performed on first and last piece of equipment    Resistance Training Performed Yes    VAD Patient? No    PAD/SET Patient? No      Pain Assessment   Currently in Pain? No/denies                Social History   Tobacco Use  Smoking Status Former   Current packs/day: 1.00   Types: Cigarettes  Smokeless Tobacco Never  Tobacco Comments   Smoked for about 30 years    Goals Met:  Independence with exercise equipment Exercise tolerated well No report of concerns or symptoms today  Goals Unmet:  Not Applicable  Comments: Pt able to follow exercise prescription today without complaint.  Will continue to monitor for progression.    Dr. Bethann Punches is Medical Director for [facility] Cardiac Rehabilitation.  Dr. Vida Rigger is Medical Director for [facility] Pulmonary Rehabilitation.

## 2023-08-26 ENCOUNTER — Encounter: Payer: BC Managed Care – PPO | Attending: Internal Medicine

## 2023-08-26 DIAGNOSIS — Z955 Presence of coronary angioplasty implant and graft: Secondary | ICD-10-CM | POA: Insufficient documentation

## 2023-08-26 NOTE — Progress Notes (Signed)
Daily Session Note  Patient Details  Name: Danny Brown MRN: [id number] Date of Birth: 10/22/1955 Referring Provider:   Flowsheet Row Cardiac Rehab from 07/03/2023 in [facility] Cardiac and Pulmonary Rehab  Referring Provider Dr. Dorothyann Peng       Encounter Date: 08/26/2023  Check In:  Session Check In - 08/26/23 1108       Check-In   Supervising physician immediately available to respond to emergencies See telemetry face sheet for immediately available ER MD    Location [facility]-Cardiac & Pulmonary Rehab    Staff Present Kelton Pillar RN,BSN,MPA;Maxon Conetta BS, Exercise Physiologist;Margaret Best, MS, Exercise Physiologist;Dantae Meunier Cloretta Ned, ACSM CEP, Exercise Physiologist    Virtual Visit No    Medication changes reported     No    Fall or balance concerns reported    No    Tobacco Cessation No Change    Warm-up and Cool-down Performed on first and last piece of equipment    Resistance Training Performed Yes    VAD Patient? No    PAD/SET Patient? No      Pain Assessment   Currently in Pain? No/denies                Social History   Tobacco Use  Smoking Status Former   Current packs/day: 1.00   Types: Cigarettes  Smokeless Tobacco Never  Tobacco Comments   Smoked for about 30 years    Goals Met:  Independence with exercise equipment Exercise tolerated well No report of concerns or symptoms today Strength training completed today  Goals Unmet:  Not Applicable  Comments: Pt able to follow exercise prescription today without complaint.  Will continue to monitor for progression.    Dr. Bethann Punches is Medical Director for [facility] Cardiac Rehabilitation.  Dr. Vida Rigger is Medical Director for [facility] Pulmonary Rehabilitation.

## 2023-08-27 ENCOUNTER — Other Ambulatory Visit: Payer: Self-pay | Admitting: Urology

## 2023-08-28 ENCOUNTER — Encounter: Payer: BC Managed Care – PPO | Admitting: *Deleted

## 2023-08-28 DIAGNOSIS — Z955 Presence of coronary angioplasty implant and graft: Secondary | ICD-10-CM

## 2023-08-28 NOTE — Progress Notes (Signed)
 Daily Session Note  Patient Details  Name: Danny Brown MRN: [id number] Date of Birth: 1956/07/11 Referring Provider:   Flowsheet Row Cardiac Rehab from 07/03/2023 in [facility] Cardiac and Pulmonary Rehab  Referring Provider Dr. Cara Lovelace       Encounter Date: 08/28/2023  Check In:  Session Check In - 08/28/23 1116       Check-In   Supervising physician immediately available to respond to emergencies See telemetry face sheet for immediately available ER MD    Location [facility]-Cardiac & Pulmonary Rehab    Staff Present Othel Durand, RN, BSN, CCRP;Margaret Best, MS, Exercise Physiologist;Joseph Rolinda RCP,RRT,BSRT;Meredith Tressa RN,BSN    Virtual Visit No    Medication changes reported     No    Fall or balance concerns reported    No    Warm-up and Cool-down Performed on first and last piece of equipment    Resistance Training Performed Yes    VAD Patient? No    PAD/SET Patient? No      Pain Assessment   Currently in Pain? No/denies                Social History   Tobacco Use  Smoking Status Former   Current packs/day: 1.00   Types: Cigarettes  Smokeless Tobacco Never  Tobacco Comments   Smoked for about 30 years    Goals Met:  Independence with exercise equipment Exercise tolerated well No report of concerns or symptoms today  Goals Unmet:  Not Applicable  Comments: Pt able to follow exercise prescription today without complaint.  Will continue to monitor for progression.    Dr. Oneil Pinal is Medical Director for [facility] Cardiac Rehabilitation.  Dr. Fuad Aleskerov is Medical Director for [facility] Pulmonary Rehabilitation.

## 2023-08-30 ENCOUNTER — Encounter: Payer: BC Managed Care – PPO | Admitting: *Deleted

## 2023-08-30 DIAGNOSIS — Z955 Presence of coronary angioplasty implant and graft: Secondary | ICD-10-CM

## 2023-08-30 NOTE — Progress Notes (Signed)
 Daily Session Note  Patient Details  Name: Danny Brown MRN: [id number] Date of Birth: [birth year] Referring Provider:   Flowsheet Row Cardiac Rehab from 07/03/2023 in [facility] Cardiac and Pulmonary Rehab  Referring Provider Dr. Cara Lovelace       Encounter Date: 08/30/2023  Check In:  Session Check In - 08/30/23 1141       Check-In   Supervising physician immediately available to respond to emergencies See telemetry face sheet for immediately available ER MD    Location [facility]-Cardiac & Pulmonary Rehab    Staff Present Othel Durand, RN, BSN, CCRP;Joseph Hood RCP,RRT,BSRT;Noah Tickle, [place], Exercise Physiologist    Virtual Visit No    Medication changes reported     No    Fall or balance concerns reported    No    Warm-up and Cool-down Performed on first and last piece of equipment    Resistance Training Performed Yes    VAD Patient? No    PAD/SET Patient? No      Pain Assessment   Currently in Pain? No/denies                Social History   Tobacco Use  Smoking Status Former   Current packs/day: 1.00   Types: Cigarettes  Smokeless Tobacco Never  Tobacco Comments   Smoked for about 30 years    Goals Met:  Independence with exercise equipment Exercise tolerated well No report of concerns or symptoms today  Goals Unmet:  Not Applicable  Comments: Pt able to follow exercise prescription today without complaint.  Will continue to monitor for progression.    Dr. Oneil Pinal is Medical Director for [facility] Cardiac Rehabilitation.  Dr. Fuad Aleskerov is Medical Director for [facility] Pulmonary Rehabilitation.

## 2023-09-02 ENCOUNTER — Encounter: Payer: BC Managed Care – PPO | Admitting: *Deleted

## 2023-09-02 DIAGNOSIS — Z955 Presence of coronary angioplasty implant and graft: Secondary | ICD-10-CM

## 2023-09-02 NOTE — Progress Notes (Signed)
 Daily Session Note  Patient Details  Name: Danny Brown MRN: [id number] Date of Birth: 04-08-1956 Referring Provider:   Flowsheet Row Cardiac Rehab from 07/03/2023 in [facility] Cardiac and Pulmonary Rehab  Referring Provider Dr. Burney Carter       Encounter Date: 09/02/2023  Check In:  Session Check In - 09/02/23 1121       Check-In   Supervising physician immediately available to respond to emergencies See telemetry face sheet for immediately available ER MD    Location [facility]-Cardiac & Pulmonary Rehab    Staff Present Maud Sorenson, RN, BSN, CCRP;Margaret Best, MS, Exercise Physiologist;Maxon Conetta BS, Exercise Physiologist;Kelly Dawne Euler, ACSM CEP, Exercise Physiologist    Virtual Visit No    Medication changes reported     No    Fall or balance concerns reported    No    Warm-up and Cool-down Performed on first and last piece of equipment    Resistance Training Performed Yes    VAD Patient? No    PAD/SET Patient? No      Pain Assessment   Currently in Pain? No/denies                Social History   Tobacco Use  Smoking Status Former   Current packs/day: 1.00   Types: Cigarettes  Smokeless Tobacco Never  Tobacco Comments   Smoked for about 30 years    Goals Met:  Independence with exercise equipment Exercise tolerated well No report of concerns or symptoms today  Goals Unmet:  Not Applicable  Comments: Pt able to follow exercise prescription today without complaint.  Will continue to monitor for progression.    Dr. Firman Hughes is Medical Director for [facility] Cardiac Rehabilitation.  Dr. Fuad Aleskerov is Medical Director for [facility] Pulmonary Rehabilitation.

## 2023-09-04 ENCOUNTER — Encounter: Payer: BC Managed Care – PPO | Admitting: *Deleted

## 2023-09-04 DIAGNOSIS — Z955 Presence of coronary angioplasty implant and graft: Secondary | ICD-10-CM

## 2023-09-04 NOTE — Progress Notes (Signed)
Daily Session Note  Patient Details  Name: [name] MRN: [id number] Date of Birth: 1956-03-08 Referring Provider:   Flowsheet Row Cardiac Rehab from 07/03/2023 in [facility] Cardiac and Pulmonary Rehab  Referring Provider Dr. Dorothyann Peng       Encounter Date: 09/04/2023  Check In:  Session Check In - 09/04/23 1122       Check-In   Supervising physician immediately available to respond to emergencies See telemetry face sheet for immediately available ER MD    Location [facility]-Cardiac & Pulmonary Rehab    Staff Present Susann Givens RN,BSN;Joseph [facility] Best, MS, Exercise Physiologist    Virtual Visit No    Medication changes reported     No    Fall or balance concerns reported    No    Warm-up and Cool-down Performed on first and last piece of equipment    Resistance Training Performed Yes    VAD Patient? No    PAD/SET Patient? No      Pain Assessment   Currently in Pain? No/denies                Social History   Tobacco Use  Smoking Status Former   Current packs/day: 1.00   Types: Cigarettes  Smokeless Tobacco Never  Tobacco Comments   Smoked for about 30 years    Goals Met:  Independence with exercise equipment Exercise tolerated well No report of concerns or symptoms today Strength training completed today  Goals Unmet:  Not Applicable  Comments: Pt able to follow exercise prescription today without complaint.  Will continue to monitor for progression.    Dr. Bethann Punches is Medical Director for [facility] Cardiac Rehabilitation.  Dr. Vida Rigger is Medical Director for [facility] Pulmonary Rehabilitation.

## 2023-09-04 NOTE — Progress Notes (Signed)
Cardiac Individual Treatment Plan  Patient Details  Name: Danny Brown MRN: [id number] Date of Birth: [birth year] Referring Provider:   Flowsheet Row Cardiac Rehab from 07/03/2023 in [facility] Cardiac and Pulmonary Rehab  Referring Provider Dr. Dorothyann Peng       Initial Encounter Date:  Flowsheet Row Cardiac Rehab from 07/03/2023 in [facility] Cardiac and Pulmonary Rehab  Date 07/03/23       Visit Diagnosis: Status post coronary artery stent placement  Patient's Home Medications on Admission:  Current Outpatient Medications:    acetaminophen (TYLENOL) 500 MG tablet, Take 1,000 mg by mouth every 6 (six) hours as needed for moderate pain (pain score 4-6)., Disp: , Rfl:    alfuzosin (UROXATRAL) 10 MG 24 hr tablet, TAKE ONE TABLET BY MOUTH ONE TIME DAILY WITH BREAKFAST, Disp: 30 tablet, Rfl: 0   aspirin EC 81 MG tablet, Take 81 mg by mouth daily. Swallow whole., Disp: , Rfl:    atomoxetine (STRATTERA) 40 MG capsule, Take 40 mg by mouth every morning., Disp: , Rfl:    enalapril (VASOTEC) 2.5 MG tablet, Take 2.5 mg by mouth daily., Disp: , Rfl:    Enalapril-hydroCHLOROthiazide 5-12.5 MG tablet, SMARTSIG:1.0 Tablet(s) By Mouth Daily, Disp: , Rfl:    ezetimibe (ZETIA) 10 MG tablet, Take 10 mg by mouth daily., Disp: , Rfl:    L-METHYLFOLATE PO, Take 1 tablet by mouth daily., Disp: , Rfl:    metFORMIN (GLUCOPHAGE-XR) 750 MG 24 hr tablet, Take 750 mg by mouth 2 (two) times daily with a meal., Disp: , Rfl:    metoprolol succinate (TOPROL-XL) 25 MG 24 hr tablet, Take 25 mg by mouth daily., Disp: , Rfl:    pravastatin (PRAVACHOL) 40 MG tablet, Take 40 mg by mouth at bedtime., Disp: , Rfl:    tadalafil (CIALIS) 20 MG tablet, Take 1 tablet (20 mg total) by mouth daily as needed for erectile dysfunction., Disp: 90 tablet, Rfl: 3   tadalafil (CIALIS) 5 MG tablet, Take 1 tablet (5 mg total) by mouth daily as needed for erectile dysfunction. (Patient not taking: Reported on 07/01/2023), Disp: 90 tablet, Rfl:  3   ticagrelor (BRILINTA) 90 MG TABS tablet, Take 1 tablet (90 mg total) by mouth 2 (two) times daily., Disp: 60 tablet, Rfl: 0   Vilazodone HCl (VIIBRYD) 40 MG TABS, Take 40 mg by mouth daily., Disp: , Rfl:   Past Medical History: Past Medical History:  Diagnosis Date   Diabetes mellitus without complication (HCC)    Dyspnea    GERD (gastroesophageal reflux disease)    Gilbert syndrome    Heart murmur    Hypercholesterolemia    Hyperlipidemia    Hypertension    Hypochloremia    Sleep apnea     Tobacco Use: Social History   Tobacco Use  Smoking Status Former   Current packs/day: 1.00   Types: Cigarettes  Smokeless Tobacco Never  Tobacco Comments   Smoked for about 30 years    Labs: Review Flowsheet        No data to display           Exercise Target Goals: Exercise Program Goal: Individual exercise prescription set using results from initial 6 min walk test and THRR while considering  patient's activity barriers and safety.   Exercise Prescription Goal: Initial exercise prescription builds to 30-45 minutes a day of aerobic activity, 2-3 days per week.  Home exercise guidelines will be given to patient during program as part of exercise prescription that the participant will  acknowledge.   Education: Aerobic Exercise: - Group verbal and visual presentation on the components of exercise prescription. Introduces F.I.T.T principle from ACSM for exercise prescriptions.  Reviews F.I.T.T. principles of aerobic exercise including progression. Written material given at graduation. Flowsheet Row Cardiac Rehab from 08/07/2023 in [facility] Cardiac and Pulmonary Rehab  Education need identified 07/03/23       Education: Resistance Exercise: - Group verbal and visual presentation on the components of exercise prescription. Introduces F.I.T.T principle from ACSM for exercise prescriptions  Reviews F.I.T.T. principles of resistance exercise including progression. Written material  given at graduation.    Education: Exercise & Equipment Safety: - Individual verbal instruction and demonstration of equipment use and safety with use of the equipment. Flowsheet Row Cardiac Rehab from 08/07/2023 in [facility] Cardiac and Pulmonary Rehab  Date 07/03/23  Educator [facility]  Instruction Review Code 1- Verbalizes Understanding       Education: Exercise Physiology & General Exercise Guidelines: - Group verbal and written instruction with models to review the exercise physiology of the cardiovascular system and associated critical values. Provides general exercise guidelines with specific guidelines to those with heart or lung disease.    Education: Flexibility, Balance, Mind/Body Relaxation: - Group verbal and visual presentation with interactive activity on the components of exercise prescription. Introduces F.I.T.T principle from ACSM for exercise prescriptions. Reviews F.I.T.T. principles of flexibility and balance exercise training including progression. Also discusses the mind body connection.  Reviews various relaxation techniques to help reduce and manage stress (i.e. Deep breathing, progressive muscle relaxation, and visualization). Balance handout provided to take home. Written material given at graduation.   Activity Barriers & Risk Stratification:  Activity Barriers & Cardiac Risk Stratification - 07/03/23 1601       Activity Barriers & Cardiac Risk Stratification   Cardiac Risk Stratification Moderate             6 Minute Walk:  6 Minute Walk     Row Name 07/03/23 1559         6 Minute Walk   Phase Initial     Distance 1335 feet     Walk Time 6 minutes     # of Rest Breaks 0     MPH 2.5     METS 2.9     RPE 9     Perceived Dyspnea  0     VO2 Peak 10.3     Symptoms No     Resting HR 74 bpm     Resting BP 94/56     Resting Oxygen Saturation  97 %     Exercise Oxygen Saturation  during 6 min walk 96 %     Max Ex. HR 99 bpm     Max Ex. BP 128/64     2  Minute Post BP 112/64              Oxygen Initial Assessment:   Oxygen Re-Evaluation:   Oxygen Discharge (Final Oxygen Re-Evaluation):   Initial Exercise Prescription:  Initial Exercise Prescription - 07/03/23 1600       Date of Initial Exercise RX and Referring Provider   Date 07/03/23    Referring Provider Dr. Dorothyann Peng      Oxygen   Maintain Oxygen Saturation 88% or higher      Treadmill   MPH 2.3    Grade 1    Minutes 15    METs 3.08      Elliptical   Level 1    Speed  3    Minutes 15    METs 2.9      Biostep-RELP   Level 3    SPM 50    Minutes 15    METs 2.9      Intensity   THRR 40-80% of Max Heartrate 104-135    Ratings of Perceived Exertion 11-13    Perceived Dyspnea 0-4      Progression   Progression Continue to progress workloads to maintain intensity without signs/symptoms of physical distress.      Resistance Training   Training Prescription Yes    Weight 8lb    Reps 10-15             Perform Capillary Blood Glucose checks as needed.  Exercise Prescription Changes:   Exercise Prescription Changes     Row Name 07/03/23 1600 07/23/23 1400 08/08/23 1500 08/19/23 1100 08/20/23 0800     Response to Exercise   Blood Pressure (Admit) 94/56 130/72 124/58 -- 118/54   Blood Pressure (Exercise) 128/64 144/68 128/68 -- 150/80   Blood Pressure (Exit) 112/64 116/62 132/68 -- 106/58   Heart Rate (Admit) 74 bpm 110 bpm 73 bpm -- 89 bpm   Heart Rate (Exercise) 99 bpm 150 bpm 129 bpm -- 120 bpm   Heart Rate (Exit) 75 bpm 117 bpm 84 bpm -- 97 bpm   Oxygen Saturation (Admit) 97 % -- -- -- --   Oxygen Saturation (Exercise) 96 % -- -- -- --   Oxygen Saturation (Exit) 96 % -- -- -- --   Rating of Perceived Exertion (Exercise) 9 13 14  -- 14   Perceived Dyspnea (Exercise) 0 -- 0 -- 0   Symptoms none none none -- none   Comments results First two weeks of exercise -- -- --   Duration -- Continue with 30 min of aerobic exercise without  signs/symptoms of physical distress. Continue with 30 min of aerobic exercise without signs/symptoms of physical distress. Continue with 30 min of aerobic exercise without signs/symptoms of physical distress. Continue with 30 min of aerobic exercise without signs/symptoms of physical distress.   Intensity -- THRR unchanged THRR unchanged THRR unchanged THRR unchanged     Progression   Progression -- Continue to progress workloads to maintain intensity without signs/symptoms of physical distress. Continue to progress workloads to maintain intensity without signs/symptoms of physical distress. Continue to progress workloads to maintain intensity without signs/symptoms of physical distress. Continue to progress workloads to maintain intensity without signs/symptoms of physical distress.   Average METs -- 3.26 3.55 3.55 3.64     Resistance Training   Training Prescription -- Yes Yes Yes Yes   Weight -- 8lb 8lb 8lb 8lb   Reps -- 10-15 10-15 10-15 10-15     Interval Training   Interval Training -- No No No No     Treadmill   MPH -- 3 2.8 2.8 2.8   Grade -- 2 2.5 2.5 2.5   Minutes -- 15 15 15 15    METs -- 4.12 4.11 4.11 4.11     Elliptical   Level -- 1 1 1 1    Speed -- 3 3.1 3.1 3.3   Minutes -- 15 15 15 15    METs -- 2.7 2.7 2.7 3     REL-XR   Level -- -- 4 4 --   Minutes -- -- 15 15 --     Home Exercise Plan   Plans to continue exercise at -- -- -- [facility] (comment)  sen[facility]  center, walking, and recumbent bike [employer] (comment)  senior center, walking, and recumbent bike   Frequency -- -- -- Add 2 additional days to program exercise sessions. Add 2 additional days to program exercise sessions.   Initial Home Exercises Provided -- -- -- 08/19/23 08/19/23     Oxygen   Maintain Oxygen Saturation -- 88% or higher 88% or higher 88% or higher 88% or higher            Exercise Comments:   Exercise Comments     Row Name 07/10/23 1725           Exercise  Comments First full day of exercise!  Patient was oriented to gym and equipment including functions, settings, policies, and procedures.  Patient's individual exercise prescription and treatment plan were reviewed.  All starting workloads were established based on the results of the 6 minute walk test done at initial orientation visit.  The plan for exercise progression was also introduced and progression will be customized based on patient's performance and goals.                Exercise Goals and Review:   Exercise Goals     Row Name 07/03/23 1609             Exercise Goals   Increase Physical Activity Yes       Intervention Develop an individualized exercise prescription for aerobic and resistive training based on initial evaluation findings, risk stratification, comorbidities and participant's personal goals.;Provide advice, education, support and counseling about physical activity/exercise needs.       Expected Outcomes Long Term: Exercising regularly at least 3-5 days a week.;Long Term: Add in home exercise to make exercise part of routine and to increase amount of physical activity.;Short Term: Attend rehab on a regular basis to increase amount of physical activity.       Increase Strength and Stamina Yes       Intervention Develop an individualized exercise prescription for aerobic and resistive training based on initial evaluation findings, risk stratification, comorbidities and participant's personal goals.;Provide advice, education, support and counseling about physical activity/exercise needs.       Expected Outcomes Long Term: Improve cardiorespiratory fitness, muscular endurance and strength as measured by increased METs and functional capacity ( );Short Term: Perform resistance training exercises routinely during rehab and add in resistance training at home;Short Term: Increase workloads from initial exercise prescription for resistance, speed, and METs.       Able to  understand and use rate of perceived exertion (RPE) scale Yes       Intervention Provide education and explanation on how to use RPE scale       Expected Outcomes Long Term:  Able to use RPE to guide intensity level when exercising independently;Short Term: Able to use RPE daily in rehab to express subjective intensity level       Able to understand and use Dyspnea scale Yes       Intervention Provide education and explanation on how to use Dyspnea scale       Expected Outcomes Long Term: Able to use Dyspnea scale to guide intensity level when exercising independently;Short Term: Able to use Dyspnea scale daily in rehab to express subjective sense of shortness of breath during exertion       Knowledge and understanding of Target Heart Rate Range (THRR) Yes       Intervention Provide education and explanation of THRR including how the numbers were predicted and where they  are located for reference       Expected Outcomes Long Term: Able to use THRR to govern intensity when exercising independently;Short Term: Able to use daily as guideline for intensity in rehab;Short Term: Able to state/look up THRR       Able to check pulse independently Yes       Intervention Review the importance of being able to check your own pulse for safety during independent exercise;Provide education and demonstration on how to check pulse in carotid and radial arteries.       Expected Outcomes Long Term: Able to check pulse independently and accurately;Short Term: Able to explain why pulse checking is important during independent exercise       Understanding of Exercise Prescription Yes       Intervention Provide education, explanation, and written materials on patient's individual exercise prescription       Expected Outcomes Long Term: Able to explain home exercise prescription to exercise independently;Short Term: Able to explain program exercise prescription                Exercise Goals Re-Evaluation :  Exercise  Goals Re-Evaluation     Row Name 07/10/23 1728 07/23/23 1408 08/08/23 1545 08/19/23 1144 08/20/23 0804     Exercise Goal Re-Evaluation   Exercise Goals Review Knowledge and understanding of Target Heart Rate Range (THRR);Able to understand and use rate of perceived exertion (RPE) scale;Increase Physical Activity;Understanding of Exercise Prescription;Increase Strength and Stamina;Able to check pulse independently Increase Physical Activity;Increase Strength and Stamina;Understanding of Exercise Prescription Increase Physical Activity;Increase Strength and Stamina;Understanding of Exercise Prescription Increase Physical Activity;Able to understand and use rate of perceived exertion (RPE) scale;Knowledge and understanding of Target Heart Rate Range (THRR);Understanding of Exercise Prescription;Increase Strength and Stamina;Able to understand and use Dyspnea scale;Able to check pulse independently Increase Physical Activity;Increase Strength and Stamina;Understanding of Exercise Prescription   Comments Reviewed RPE and dyspnea scale, THR and program prescription with pt today.  Pt voiced understanding and was given a copy of goals to take home. Danny Brown is off to a good start in the program. He did well during his first two weeks of rehab, improving his treadmill workload to a speed of 3 mph and an incline of 2%. He also did well on the elliptical at level 1. We will continue to monitor his progress in the program. Danny Brown continues to do well in the program. He was able to increase his grade on the treadmill from 2% to 2.5%. He was also able to add the XR to his current exercise prescription at level 4. We will continue to monitor his progress in the program. Reviewed home exercise with pt today.  Pt plans to walk, use recumbent bike, and join the senior center for exercise.  Reviewed THR, pulse, RPE, sign and symptoms, pulse oximetery and when to call 911 or MD.  Also discussed weather considerations and indoor  options.  Pt voiced understanding. Danny Brown is doing well in rehab. He was recently able to increase his speed on the elliptical from 3. to 3. at level 1. He was also able to maintain his workload on the treadmill at a speed of 2. and an incline of 2.5%. We will continue to monitor his progress in the program.   Expected Outcomes Short: Use RPE daily to regulate intensity.  Long: Follow program prescription in THR. Short: Continue to follow current exercise prescription. Long: Continue exercise to improve strength and stamina. Short: Continue to follow current exercise prescription. Long: Continue  exercise to improve strength and stamina. Short: add 1-2 days of home exercise on off days of cardiac rehab. Long: become independent with exercise routine upon graduation from cardiac rehab. Short: Continue to increase treadmill and elliptical workloads. Long: Continue exercise to improve strength and stamina.            Discharge Exercise Prescription (Final Exercise Prescription Changes):  Exercise Prescription Changes - 08/20/23 0800       Response to Exercise   Blood Pressure (Admit) 118/54    Blood Pressure (Exercise) 150/80    Blood Pressure (Exit) 106/58    Heart Rate (Admit) 89 bpm    Heart Rate (Exercise) 120 bpm    Heart Rate (Exit) 97 bpm    Rating of Perceived Exertion (Exercise) 14    Perceived Dyspnea (Exercise) 0    Symptoms none    Duration Continue with 30 min of aerobic exercise without signs/symptoms of physical distress.    Intensity THRR unchanged      Progression   Progression Continue to progress workloads to maintain intensity without signs/symptoms of physical distress.    Average METs 3.64      Resistance Training   Training Prescription Yes    Weight 8lb    Reps 10-15      Interval Training   Interval Training No      Treadmill   MPH 2.8    Grade 2.5    Minutes 15    METs 4.11      Elliptical   Level 1    Speed 3.3    Minutes 15    METs 3       Home Exercise Plan   Plans to continue exercise at [employer] (comment)   senior center, walking, and recumbent bike   Frequency Add 2 additional days to program exercise sessions.    Initial Home Exercises Provided 08/19/23      Oxygen   Maintain Oxygen Saturation 88% or higher             Nutrition:  Target Goals: Understanding of nutrition guidelines, daily intake of sodium 1500mg , cholesterol 200mg , calories 30% from fat and 7% or less from saturated fats, daily to have 5 or more servings of fruits and vegetables.  Education: All About Nutrition: -Group instruction provided by verbal, written material, interactive activities, discussions, models, and posters to present general guidelines for heart healthy nutrition including fat, fiber, MyPlate, the role of sodium in heart healthy nutrition, utilization of the nutrition label, and utilization of this knowledge for meal planning. Follow up email sent as well. Written material given at graduation. Flowsheet Row Cardiac Rehab from 08/07/2023 in [facility] Cardiac and Pulmonary Rehab  Education need identified 07/03/23  Date 08/07/23  Educator JG  Instruction Review Code 1- Verbalizes Understanding       Biometrics:  Pre Biometrics - 07/03/23 1609       Pre Biometrics   Height 5' 3.58" (1.615 m)    Weight 156 lb 12.8 oz (71.1 kg)    Waist Circumference 38.5 inches    Hip Circumference 36 inches    Waist to Hip Ratio 1.07 %    BMI (Calculated) 27.27    Single Leg Stand 30 seconds              Nutrition Therapy Plan and Nutrition Goals:   Nutrition Assessments:  MEDIFICTS Score Key: >=70 Need to make dietary changes  40-70 Heart Healthy Diet <= 40 Therapeutic Level Cholesterol Diet  Flowsheet  Row Cardiac Rehab from 07/03/2023 in [facility] Cardiac and Pulmonary Rehab  Picture Your Plate Total Score on Admission 68      Picture Your Plate Scores: <16 Unhealthy dietary pattern with much room for  improvement. 41-50 Dietary pattern unlikely to meet recommendations for good health and room for improvement. 51-60 More healthful dietary pattern, with some room for improvement.  >60 Healthy dietary pattern, although there may be some specific behaviors that could be improved.    Nutrition Goals Re-Evaluation:  Nutrition Goals Re-Evaluation     Row Name 08/05/23 1130             Goals   Comment Patient deferred individual RD appointment but plans to come to group education classes this week and next week.                Nutrition Goals Discharge (Final Nutrition Goals Re-Evaluation):  Nutrition Goals Re-Evaluation - 08/05/23 1130       Goals   Comment Patient deferred individual RD appointment but plans to come to group education classes this week and next week.             Psychosocial: Target Goals: Acknowledge presence or absence of significant depression and/or stress, maximize coping skills, provide positive support system. Participant is able to verbalize types and ability to use techniques and skills needed for reducing stress and depression.   Education: Stress, Anxiety, and Depression - Group verbal and visual presentation to define topics covered.  Reviews how body is impacted by stress, anxiety, and depression.  Also discusses healthy ways to reduce stress and to treat/manage anxiety and depression.  Written material given at graduation.   Education: Sleep Hygiene -Provides group verbal and written instruction about how sleep can affect your health.  Define sleep hygiene, discuss sleep cycles and impact of sleep habits. Review good sleep hygiene tips.    Initial Review & Psychosocial Screening:  Initial Psych Review & Screening - 07/01/23 1114       Initial Review   Current issues with Current Psychotropic Meds;Current Anxiety/Panic;Current Depression      Family Dynamics   Good Support System? Yes    Comments His depression is just "there" and  worries about everything. He lives his wife that is a great support system. His daughters and his therapist are good supporters also.      Barriers   Psychosocial barriers to participate in program The patient should benefit from training in stress management and relaxation.      Screening Interventions   Interventions Encouraged to exercise;To provide support and resources with identified psychosocial needs;Provide feedback about the scores to participant    Expected Outcomes Short Term goal: Utilizing psychosocial counselor, staff and physician to assist with identification of specific Stressors or current issues interfering with healing process. Setting desired goal for each stressor or current issue identified.;Long Term Goal: Stressors or current issues are controlled or eliminated.;Long Term goal: The participant improves quality of Life and PHQ9 Scores as seen by post scores and/or verbalization of changes;Short Term goal: Identification and review with participant of any Quality of Life or Depression concerns found by scoring the questionnaire.             Quality of Life Scores:   Quality of Life - 07/03/23 1604       Quality of Life   Select Quality of Life      Quality of Life Scores   Health/Function Pre 25.6 %    Socioeconomic  Pre 22.75 %    Psych/Spiritual Pre 24.21 %    Family Pre 27.6 %    GLOBAL Pre 24.96 %            Scores of 19 and below usually indicate a poorer quality of life in these areas.  A difference of  2-3 points is a clinically meaningful difference.  A difference of 2-3 points in the total score of the Quality of Life Index has been associated with significant improvement in overall quality of life, self-image, physical symptoms, and general health in studies assessing change in quality of life.  PHQ-9: Review Flowsheet       07/03/2023  Depression screen PHQ 2/9  Decreased Interest 0  Down, Depressed, Hopeless 1  PHQ - 2 Score 1  Altered  sleeping 0  Tired, decreased energy 1  Change in appetite 0  Feeling bad or failure about yourself  0  Trouble concentrating 0  Moving slowly or fidgety/restless 1  Suicidal thoughts 0  PHQ-9 Score 3  Difficult doing work/chores Not difficult at all   Interpretation of Total Score  Total Score Depression Severity:  1-4 = Minimal depression, 5-9 = Mild depression, 10-14 = Moderate depression, 15-19 = Moderately severe depression, 20-27 = Severe depression   Psychosocial Evaluation and Intervention:  Psychosocial Evaluation - 07/01/23 1116       Psychosocial Evaluation & Interventions   Interventions Relaxation education;Stress management education;Encouraged to exercise with the program and follow exercise prescription    Comments His depression is just "there" and worries about everything. He lives his wife that is a great support system. His daughters and his therapist are good supporters also.    Expected Outcomes Short: Start [facility] to help with mood. Long: Maintain a healthy mental state    Continue Psychosocial Services  Follow up required by staff             Psychosocial Re-Evaluation:  Psychosocial Re-Evaluation     Row Name 08/05/23 1135             Psychosocial Re-Evaluation   Current issues with Current Psychotropic Meds;Current Depression;Current Anxiety/Panic       Comments Short: patient reports no changes or concern in mental health, sleep, or stress. He uses a CPAP to sleep, which helps with sleep quality. He continues to have a good supports system.       Expected Outcomes Short: continue to attend cardiac rehab consistently for mental health benefits of exercise. Long: maintain good and stable mental health habits.       Interventions Encouraged to attend Cardiac Rehabilitation for the exercise       Continue Psychosocial Services  Follow up required by staff                Psychosocial Discharge (Final Psychosocial Re-Evaluation):   Psychosocial Re-Evaluation - 08/05/23 1135       Psychosocial Re-Evaluation   Current issues with Current Psychotropic Meds;Current Depression;Current Anxiety/Panic    Comments Short: patient reports no changes or concern in mental health, sleep, or stress. He uses a CPAP to sleep, which helps with sleep quality. He continues to have a good supports system.    Expected Outcomes Short: continue to attend cardiac rehab consistently for mental health benefits of exercise. Long: maintain good and stable mental health habits.    Interventions Encouraged to attend Cardiac Rehabilitation for the exercise    Continue Psychosocial Services  Follow up required by staff  Vocational Rehabilitation: Provide vocational rehab assistance to qualifying candidates.   Vocational Rehab Evaluation & Intervention:   Education: Education Goals: Education classes will be provided on a variety of topics geared toward better understanding of heart health and risk factor modification. Participant will state understanding/return demonstration of topics presented as noted by education test scores.  Learning Barriers/Preferences:  Learning Barriers/Preferences - 07/01/23 1113       Learning Barriers/Preferences   Learning Barriers None    Learning Preferences None             General Cardiac Education Topics:  AED/CPR: - Group verbal and written instruction with the use of models to demonstrate the basic use of the AED with the basic ABC's of resuscitation.   Anatomy and Cardiac Procedures: - Group verbal and visual presentation and models provide information about basic cardiac anatomy and function. Reviews the testing methods done to diagnose heart disease and the outcomes of the test results. Describes the treatment choices: Medical Management, Angioplasty, or Coronary Bypass Surgery for treating various heart conditions including Myocardial Infarction, Angina, Valve Disease, and  Cardiac Arrhythmias.  Written material given at graduation. Flowsheet Row Cardiac Rehab from 08/07/2023 in [facility] Cardiac and Pulmonary Rehab  Education need identified 07/03/23       Medication Safety: - Group verbal and visual instruction to review commonly prescribed medications for heart and lung disease. Reviews the medication, class of the drug, and side effects. Includes the steps to properly store meds and maintain the prescription regimen.  Written material given at graduation.   Intimacy: - Group verbal instruction through game format to discuss how heart and lung disease can affect sexual intimacy. Written material given at graduation..   Know Your Numbers and Heart Failure: - Group verbal and visual instruction to discuss disease risk factors for cardiac and pulmonary disease and treatment options.  Reviews associated critical values for Overweight/Obesity, Hypertension, Cholesterol, and Diabetes.  Discusses basics of heart failure: signs/symptoms and treatments.  Introduces Heart Failure Zone chart for action plan for heart failure.  Written material given at graduation.   Infection Prevention: - Provides verbal and written material to individual with discussion of infection control including proper hand washing and proper equipment cleaning during exercise session. Flowsheet Row Cardiac Rehab from 08/07/2023 in [facility] Cardiac and Pulmonary Rehab  Date 07/03/23  Educator [facility]  Instruction Review Code 1- Verbalizes Understanding       Falls Prevention: - Provides verbal and written material to individual with discussion of falls prevention and safety. Flowsheet Row Cardiac Rehab from 08/07/2023 in [facility] Cardiac and Pulmonary Rehab  Date 07/03/23  Educator [facility]  Instruction Review Code 1- Verbalizes Understanding       Other: -Provides group and verbal instruction on various topics (see comments)   Knowledge Questionnaire Score:  Knowledge Questionnaire Score - 07/03/23  1606       Knowledge Questionnaire Score   Pre Score 22             Core Components/Risk Factors/Patient Goals at Admission:  Personal Goals and Risk Factors at Admission - 07/03/23 1606       Core Components/Risk Factors/Patient Goals on Admission    Weight Management Yes;Weight Loss    Intervention Weight Management: Develop a combined nutrition and exercise program designed to reach desired caloric intake, while maintaining appropriate intake of nutrient and fiber, sodium and fats, and appropriate energy expenditure required for the weight goal.;Weight Management: Provide education and appropriate resources to help participant work on  and attain dietary goals.;Weight Management/Obesity: Establish reasonable short term and long term weight goals.    Admit Weight 156 lb 12.8 oz (71.1 kg)    Goal Weight: Short Term 150 lb (68 kg)    Goal Weight: Long Term 140 lb (63.5 kg)    Expected Outcomes Short Term: Continue to assess and modify interventions until short term weight is achieved;Long Term: Adherence to nutrition and physical activity/exercise program aimed toward attainment of established weight goal;Weight Loss: Understanding of general recommendations for a balanced deficit meal plan, which promotes 1-2 lb weight loss per week and includes a negative energy balance of [phone] kcal/d;Understanding recommendations for meals to include 15-35% energy as protein, 25-35% energy from fat, 35-60% energy from carbohydrates, less than 200mg  of dietary cholesterol, 20-35 gm of total fiber daily;Understanding of distribution of calorie intake throughout the day with the consumption of 4-5 meals/snacks    Diabetes Yes    Intervention Provide education about signs/symptoms and action to take for hypo/hyperglycemia.;Provide education about proper nutrition, including hydration, and aerobic/resistive exercise prescription along with prescribed medications to achieve blood glucose in normal ranges:  Fasting glucose 65-99 mg/dL    Expected Outcomes Long Term: Attainment of HbA1C < 7%.;Short Term: Participant verbalizes understanding of the signs/symptoms and immediate care of hyper/hypoglycemia, proper foot care and importance of medication, aerobic/resistive exercise and nutrition plan for blood glucose control.    Hypertension Yes    Intervention Provide education on lifestyle modifcations including regular physical activity/exercise, weight management, moderate sodium restriction and increased consumption of fresh fruit, vegetables, and low fat dairy, alcohol moderation, and smoking cessation.;Monitor prescription use compliance.    Expected Outcomes Short Term: Continued assessment and intervention until BP is < 140/28mm HG in hypertensive participants. < 130/24mm HG in hypertensive participants with diabetes, heart failure or chronic kidney disease.;Long Term: Maintenance of blood pressure at goal levels.    Lipids Yes    Intervention Provide education and support for participant on nutrition & aerobic/resistive exercise along with prescribed medications to achieve LDL 70mg , HDL >40mg .    Expected Outcomes Short Term: Participant states understanding of desired cholesterol values and is compliant with medications prescribed. Participant is following exercise prescription and nutrition guidelines.;Long Term: Cholesterol controlled with medications as prescribed, with individualized exercise RX and with personalized nutrition plan. Value goals: LDL < 70mg , HDL > 40 mg.             Education:Diabetes - Individual verbal and written instruction to review signs/symptoms of diabetes, desired ranges of glucose level fasting, after meals and with exercise. Acknowledge that pre and post exercise glucose checks will be done for 3 sessions at entry of program. Flowsheet Row Cardiac Rehab from 08/07/2023 in [facility] Cardiac and Pulmonary Rehab  Date 07/03/23  Educator [facility]  Instruction Review Code 1-  Verbalizes Understanding       Core Components/Risk Factors/Patient Goals Review:   Goals and Risk Factor Review     Row Name 08/05/23 1131             Core Components/Risk Factors/Patient Goals Review   Personal Goals Review Weight Management/Obesity;Hypertension;Lipids;Diabetes       Review Patient reports that his weight has been steady. He also monitors blood pressure and blood sugar at home regularly. He reports is blood pressure is always good and his blood sugars are most of the time in acceptable ranges. He takes all his blood pressure, diabetes, and  cholesterol medication as prescribed and follows up with his doctor regularly for  lab work to help manage his cardiac risk factors.       Expected Outcomes Short: continue to monitor blood pressure and blood sugars at home. Long: manage cardiac risk factors long term.                Core Components/Risk Factors/Patient Goals at Discharge (Final Review):   Goals and Risk Factor Review - 08/05/23 1131       Core Components/Risk Factors/Patient Goals Review   Personal Goals Review Weight Management/Obesity;Hypertension;Lipids;Diabetes    Review Patient reports that his weight has been steady. He also monitors blood pressure and blood sugar at home regularly. He reports is blood pressure is always good and his blood sugars are most of the time in acceptable ranges. He takes all his blood pressure, diabetes, and  cholesterol medication as prescribed and follows up with his doctor regularly for lab work to help manage his cardiac risk factors.    Expected Outcomes Short: continue to monitor blood pressure and blood sugars at home. Long: manage cardiac risk factors long term.             ITP Comments:  ITP Comments     Row Name 07/01/23 1112 07/03/23 1559 07/10/23 1155 07/10/23 1724 08/07/23 1313   ITP Comments Virtual Visit completed. Patient informed on EP and RD appointment and 6 Minute walk test. Patient also informed of  patient health questionnaires on My Chart. Patient Verbalizes understanding. Visit diagnosis can be found in [facility] 06/19/[facility]. Completed and gym orientation. Initial ITP created and sent for review to Dr. Bethann Punches, Medical Director. 30 Day review completed. Medical Director ITP review done, changes made as directed, and signed approval by Medical Director.    new to program First full day of exercise!  Patient was oriented to gym and equipment including functions, settings, policies, and procedures.  Patient's individual exercise prescription and treatment plan were reviewed.  All starting workloads were established based on the results of the 6 minute walk test done at initial orientation visit.  The plan for exercise progression was also introduced and progression will be customized based on patient's performance and goals. 30 Day review completed. Medical Director ITP review done, changes made as directed, and signed approval by Medical Director.    Row Name 09/04/23 0837           ITP Comments 30 Day review completed. Medical Director ITP review done, changes made as directed, and signed approval by Medical Director.                Comments: 30 day review

## 2023-09-06 DIAGNOSIS — Z955 Presence of coronary angioplasty implant and graft: Secondary | ICD-10-CM

## 2023-09-06 NOTE — Progress Notes (Signed)
Daily Session Note  Patient Details  Name: Danny Brown MRN: [id number] Date of Birth: 1956/04/24 Referring Provider:   Flowsheet Row Cardiac Rehab from 07/03/2023 in [facility] Cardiac and Pulmonary Rehab  Referring Provider Dr. Dorothyann Peng       Encounter Date: 09/06/2023  Check In:  Session Check In - 09/06/23 1117       Check-In   Supervising physician immediately available to respond to emergencies See telemetry face sheet for immediately available ER MD    Location [facility]-Cardiac & Pulmonary Rehab    Staff Present Kelton Pillar RN,BSN,MPA;Noah Tickle, BS, Exercise Physiologist;Joseph Hollace Kinnier    Virtual Visit No    Medication changes reported     No    Fall or balance concerns reported    No    Tobacco Cessation No Change    Warm-up and Cool-down Performed on first and last piece of equipment    Resistance Training Performed Yes    VAD Patient? No    PAD/SET Patient? No      Pain Assessment   Currently in Pain? No/denies                Social History   Tobacco Use  Smoking Status Former   Current packs/day: 1.00   Types: Cigarettes  Smokeless Tobacco Never  Tobacco Comments   Smoked for about 30 years    Goals Met:  Independence with exercise equipment Exercise tolerated well No report of concerns or symptoms today Strength training completed today  Goals Unmet:  Not Applicable  Comments: Pt able to follow exercise prescription today without complaint.  Will continue to monitor for progression.    Dr. Bethann Punches is Medical Director for [facility] Cardiac Rehabilitation.  Dr. Vida Rigger is Medical Director for [facility] Pulmonary Rehabilitation.

## 2023-09-09 ENCOUNTER — Encounter: Payer: BC Managed Care – PPO | Admitting: *Deleted

## 2023-09-09 DIAGNOSIS — Z955 Presence of coronary angioplasty implant and graft: Secondary | ICD-10-CM | POA: Diagnosis not present

## 2023-09-09 NOTE — Progress Notes (Signed)
Daily Session Note  Patient Details  Name: Danny Brown MRN: [id number] Date of Birth: [birth year] Referring Provider:   Flowsheet Row Cardiac Rehab from 07/03/2023 in [facility] Cardiac and Pulmonary Rehab  Referring Provider Dr. Dorothyann Peng       Encounter Date: 09/09/2023  Check In:  Session Check In - 09/09/23 1118       Check-In   Supervising physician immediately available to respond to emergencies See telemetry face sheet for immediately available ER MD    Location [facility]-Cardiac & Pulmonary Rehab    Staff Present Susann Givens RN,BSN;Gemma Ruan, RN, BSN, CCRP;Maxon Conetta BS, Exercise Physiologist;Kelly [employer], ACSM CEP, Exercise Physiologist    Virtual Visit No    Medication changes reported     No    Fall or balance concerns reported    No    Warm-up and Cool-down Performed on first and last piece of equipment    Resistance Training Performed Yes    VAD Patient? No    PAD/SET Patient? No      Pain Assessment   Currently in Pain? No/denies                Social History   Tobacco Use  Smoking Status Former   Current packs/day: 1.00   Types: Cigarettes  Smokeless Tobacco Never  Tobacco Comments   Smoked for about 30 years    Goals Met:  Independence with exercise equipment Exercise tolerated well No report of concerns or symptoms today  Goals Unmet:  Not Applicable  Comments: Pt able to follow exercise prescription today without complaint.  Will continue to monitor for progression.    Dr. Bethann Punches is Medical Director for [facility] Cardiac Rehabilitation.  Dr. Vida Rigger is Medical Director for [facility] Pulmonary Rehabilitation.

## 2023-09-11 ENCOUNTER — Encounter: Payer: BC Managed Care – PPO | Admitting: *Deleted

## 2023-09-11 DIAGNOSIS — Z955 Presence of coronary angioplasty implant and graft: Secondary | ICD-10-CM

## 2023-09-11 NOTE — Progress Notes (Signed)
Daily Session Note  Patient Details  Name: Danny Brown MRN: [id number] Date of Birth: [birth year] Referring Provider:   Flowsheet Row Cardiac Rehab from 07/03/2023 in [facility] Cardiac and Pulmonary Rehab  Referring Provider Dr. Dorothyann Peng       Encounter Date: 09/11/2023  Check In:  Session Check In - 09/11/23 1119       Check-In   Supervising physician immediately available to respond to emergencies See telemetry face sheet for immediately available ER MD    Location [facility]-Cardiac & Pulmonary Rehab    Staff Present Cora Collum, RN, BSN, CCRP;Margaret Best, MS, Exercise Physiologist;Maxon Conetta BS, Exercise Physiologist;Joseph Reino Kent RCP,RRT,BSRT    Virtual Visit No    Medication changes reported     No    Fall or balance concerns reported    No    Warm-up and Cool-down Performed on first and last piece of equipment    Resistance Training Performed Yes    VAD Patient? No    PAD/SET Patient? No      Pain Assessment   Currently in Pain? No/denies                Social History   Tobacco Use  Smoking Status Former   Current packs/day: 1.00   Types: Cigarettes  Smokeless Tobacco Never  Tobacco Comments   Smoked for about 30 years    Goals Met:  Independence with exercise equipment Exercise tolerated well No report of concerns or symptoms today  Goals Unmet:  Not Applicable  Comments: Pt able to follow exercise prescription today without complaint.  Will continue to monitor for progression.    Dr. Bethann Punches is Medical Director for [facility] Cardiac Rehabilitation.  Dr. Vida Rigger is Medical Director for [facility] Pulmonary Rehabilitation.

## 2023-09-13 ENCOUNTER — Encounter: Payer: BC Managed Care – PPO | Admitting: *Deleted

## 2023-09-13 DIAGNOSIS — Z955 Presence of coronary angioplasty implant and graft: Secondary | ICD-10-CM

## 2023-09-13 NOTE — Progress Notes (Signed)
Daily Session Note  Patient Details  Name: Danny Brown MRN: [id number] Date of Birth: 12/17/1955 Referring Provider:   Flowsheet Row Cardiac Rehab from 07/03/2023 in [facility] Cardiac and Pulmonary Rehab  Referring Provider Dr. Dorothyann Peng       Encounter Date: 09/13/2023  Check In:  Session Check In - 09/13/23 1113       Check-In   Supervising physician immediately available to respond to emergencies See telemetry face sheet for immediately available ER MD    Location [facility]-Cardiac & Pulmonary Rehab    Staff Present Cora Collum, RN, BSN, CCRP;Joseph Hood RCP,RRT,BSRT;Noah Tickle, [place], Exercise Physiologist    Virtual Visit No    Medication changes reported     No    Fall or balance concerns reported    No    Warm-up and Cool-down Performed on first and last piece of equipment    Resistance Training Performed Yes    VAD Patient? No    PAD/SET Patient? No      Pain Assessment   Currently in Pain? No/denies                Social History   Tobacco Use  Smoking Status Former   Current packs/day: 1.00   Types: Cigarettes  Smokeless Tobacco Never  Tobacco Comments   Smoked for about 30 years    Goals Met:  Independence with exercise equipment Exercise tolerated well No report of concerns or symptoms today  Goals Unmet:  Not Applicable  Comments: Pt able to follow exercise prescription today without complaint.  Will continue to monitor for progression.    Dr. Bethann Punches is Medical Director for [facility] Cardiac Rehabilitation.  Dr. Vida Rigger is Medical Director for [facility] Pulmonary Rehabilitation.

## 2023-09-16 ENCOUNTER — Encounter: Payer: BC Managed Care – PPO | Admitting: *Deleted

## 2023-09-16 DIAGNOSIS — Z955 Presence of coronary angioplasty implant and graft: Secondary | ICD-10-CM | POA: Diagnosis not present

## 2023-09-16 NOTE — Progress Notes (Signed)
 Daily Session Note  Patient Details  Name: Danny Brown MRN: [id number] Date of Birth: 1955-10-31 Referring Provider:   Flowsheet Row Cardiac Rehab from 07/03/2023 in [facility] Cardiac and Pulmonary Rehab  Referring Provider Dr. Dorothyann Peng       Encounter Date: 09/16/2023  Check In:  Session Check In - 09/16/23 1113       Check-In   Supervising physician immediately available to respond to emergencies See telemetry face sheet for immediately available ER MD    Location [facility]-Cardiac & Pulmonary Rehab    Staff Present Cora Collum, RN, BSN, CCRP;Kelly Hayes BS, ACSM CEP, Exercise Physiologist;Margaret Best, MS, Exercise Physiologist;Maxon Conetta BS, Exercise Physiologist    Virtual Visit No    Medication changes reported     No    Fall or balance concerns reported    No    Warm-up and Cool-down Performed on first and last piece of equipment    Resistance Training Performed Yes    VAD Patient? No    PAD/SET Patient? No      Pain Assessment   Currently in Pain? No/denies                Social History   Tobacco Use  Smoking Status Former   Current packs/day: 1.00   Types: Cigarettes  Smokeless Tobacco Never  Tobacco Comments   Smoked for about 30 years    Goals Met:  Independence with exercise equipment Exercise tolerated well No report of concerns or symptoms today  Goals Unmet:  Not Applicable  Comments: Pt able to follow exercise prescription today without complaint.  Will continue to monitor for progression.    Dr. Bethann Punches is Medical Director for [facility] Cardiac Rehabilitation.  Dr. Vida Rigger is Medical Director for [facility] Pulmonary Rehabilitation.

## 2023-09-18 ENCOUNTER — Encounter: Payer: BC Managed Care – PPO | Admitting: *Deleted

## 2023-09-18 DIAGNOSIS — Z955 Presence of coronary angioplasty implant and graft: Secondary | ICD-10-CM | POA: Diagnosis not present

## 2023-09-18 NOTE — Progress Notes (Signed)
 Daily Session Note  Patient Details  Name: Danny Brown MRN: [id number] Date of Birth: 22-Feb-1956 Referring Provider:   Flowsheet Row Cardiac Rehab from 07/03/2023 in [facility] Cardiac and Pulmonary Rehab  Referring Provider Dr. Dorothyann Peng       Encounter Date: 09/18/2023  Check In:  Session Check In - 09/18/23 1128       Check-In   Supervising physician immediately available to respond to emergencies See telemetry face sheet for immediately available ER MD    Location [facility]-Cardiac & Pulmonary Rehab    Staff Present Susann Givens RN,BSN;Joseph [facility] Best, MS, Exercise Physiologist    Virtual Visit No    Medication changes reported     No    Fall or balance concerns reported    No    Warm-up and Cool-down Performed on first and last piece of equipment    Resistance Training Performed Yes    VAD Patient? No    PAD/SET Patient? No      Pain Assessment   Currently in Pain? No/denies                Social History   Tobacco Use  Smoking Status Former   Current packs/day: 1.00   Types: Cigarettes  Smokeless Tobacco Never  Tobacco Comments   Smoked for about 30 years    Goals Met:  Independence with exercise equipment Exercise tolerated well No report of concerns or symptoms today Strength training completed today  Goals Unmet:  Not Applicable  Comments: Pt able to follow exercise prescription today without complaint.  Will continue to monitor for progression.    Dr. Bethann Punches is Medical Director for [facility] Cardiac Rehabilitation.  Dr. Vida Rigger is Medical Director for [facility] Pulmonary Rehabilitation.

## 2023-09-20 ENCOUNTER — Encounter: Payer: BC Managed Care – PPO | Admitting: *Deleted

## 2023-09-20 VITALS — Ht 63.58 in | Wt 151.6 lb

## 2023-09-20 DIAGNOSIS — Z955 Presence of coronary angioplasty implant and graft: Secondary | ICD-10-CM

## 2023-09-20 NOTE — Progress Notes (Signed)
 Daily Session Note  Patient Details  Name: Danny Brown MRN: [id number] Date of Birth: 1955-08-13 Referring Provider:   Flowsheet Row Cardiac Rehab from 07/03/2023 in [facility] Cardiac and Pulmonary Rehab  Referring Provider Dr. Dorothyann Peng       Encounter Date: 09/20/2023  Check In:  Session Check In - 09/20/23 1111       Check-In   Supervising physician immediately available to respond to emergencies See telemetry face sheet for immediately available ER MD    Location [facility]-Cardiac & Pulmonary Rehab    Staff Present Susann Givens RN,BSN;Joseph Weyman Pedro, [place], Exercise Physiologist    Virtual Visit No    Medication changes reported     No    Fall or balance concerns reported    No    Warm-up and Cool-down Performed on first and last piece of equipment    Resistance Training Performed Yes    VAD Patient? No    PAD/SET Patient? No      Pain Assessment   Currently in Pain? No/denies                Social History   Tobacco Use  Smoking Status Former   Current packs/day: 1.00   Types: Cigarettes  Smokeless Tobacco Never  Tobacco Comments   Smoked for about 30 years    Goals Met:  Independence with exercise equipment Exercise tolerated well No report of concerns or symptoms today Strength training completed today  Goals Unmet:  Not Applicable  Comments: Pt able to follow exercise prescription today without complaint.  Will continue to monitor for progression.    Dr. Bethann Punches is Medical Director for [facility] Cardiac Rehabilitation.  Dr. Vida Rigger is Medical Director for [facility] Pulmonary Rehabilitation.

## 2023-09-20 NOTE — Patient Instructions (Signed)
 Discharge Patient Instructions  Patient Details  Name: Danny Brown MRN: [id number] Date of Birth: [birth year] Referring Provider:  Jerrilyn Cairo Primary [facility]*   Number of Visits: 36  Reason for Discharge:  Patient reached a stable level of exercise. Patient independent in their exercise. Patient has met program and personal goals.  Diagnosis:  Status post coronary artery stent placement  Initial Exercise Prescription:  Initial Exercise Prescription - 07/03/23 1600       Date of Initial Exercise RX and Referring Provider   Date 07/03/23    Referring Provider Dr. Dorothyann Peng      Oxygen   Maintain Oxygen Saturation 88% or higher      Treadmill   MPH 2.3    Grade 1    Minutes 15    METs 3.08      Elliptical   Level 1    Speed 3    Minutes 15    METs 2.9      Biostep-RELP   Level 3    SPM 50    Minutes 15    METs 2.9      Intensity   THRR 40-80% of Max Heartrate 104-135    Ratings of Perceived Exertion 11-13    Perceived Dyspnea 0-4      Progression   Progression Continue to progress workloads to maintain intensity without signs/symptoms of physical distress.      Resistance Training   Training Prescription Yes    Weight 8lb    Reps 10-15             Discharge Exercise Prescription (Final Exercise Prescription Changes):  Exercise Prescription Changes - 09/19/23 1000       Response to Exercise   Blood Pressure (Admit) 124/62    Blood Pressure (Exit) 104/60    Heart Rate (Admit) 103 bpm    Heart Rate (Exercise) 152 bpm    Heart Rate (Exit) 115 bpm    Rating of Perceived Exertion (Exercise) 14    Symptoms none    Duration Continue with 30 min of aerobic exercise without signs/symptoms of physical distress.    Intensity THRR unchanged      Progression   Progression Continue to progress workloads to maintain intensity without signs/symptoms of physical distress.    Average METs 5.2      Resistance Training   Training Prescription Yes     Weight 8 lb    Reps 10-15      Interval Training   Interval Training No      Treadmill   MPH 3.3    Grade 4.5    Minutes 15    METs 5.57      Elliptical   Level 6    Speed 3.5    Minutes 15    METs 5.6      Home Exercise Plan   Plans to continue exercise at [employer] (comment)   sen[facility], walking, and recumbent bike   Frequency Add 2 additional days to program exercise sessions.    Initial Home Exercises Provided 08/19/23      Oxygen   Maintain Oxygen Saturation 88% or higher             Functional Capacity:  6 Minute Walk     Row Name 07/03/23 1559 09/20/23 1127       6 Minute Walk   Phase Initial Discharge    Distance 1335 feet 1545 feet    Distance % Change -- 15.7 %  Distance Feet Change -- 210 ft    Walk Time 6 minutes 6 minutes    # of Rest Breaks 0 0    MPH 2.5 2.93    METS 2.9 3.16    RPE 9 9    Perceived Dyspnea  0 0    VO2 Peak 10.3 11.07    Symptoms No No    Resting HR 74 bpm 87 bpm    Resting BP 94/56 96/54    Resting Oxygen Saturation  97 % 99 %    Exercise Oxygen Saturation  during 6 min walk 96 % 98 %    Max Ex. HR 99 bpm 112 bpm    Max Ex. BP 128/64 108/60    2 Minute Post BP 112/64 --            Nutrition & Weight - Outcomes:  Pre Biometrics - 07/03/23 1609       Pre Biometrics   Height 5' 3.58" (1.615 m)    Weight 156 lb 12.8 oz (71.1 kg)    Waist Circumference 38.5 inches    Hip Circumference 36 inches    Waist to Hip Ratio 1.07 %    BMI (Calculated) 27.27    Single Leg Stand 30 seconds             Post Biometrics - 09/20/23 1128        Post  Biometrics   Height 5' 3.58" (1.615 m)    Weight 151 lb 9.6 oz (68.8 kg)    Waist Circumference 37 inches    Hip Circumference 35 inches    Waist to Hip Ratio 1.06 %    BMI (Calculated) 26.36    Single Leg Stand 30 seconds

## 2023-09-22 ENCOUNTER — Other Ambulatory Visit: Payer: Self-pay | Admitting: Urology

## 2023-09-23 ENCOUNTER — Encounter: Payer: BC Managed Care – PPO | Attending: Internal Medicine | Admitting: *Deleted

## 2023-09-23 DIAGNOSIS — Z955 Presence of coronary angioplasty implant and graft: Secondary | ICD-10-CM | POA: Diagnosis present

## 2023-09-23 NOTE — Progress Notes (Signed)
 Daily Session Note  Patient Details  Name: Danny Brown MRN: [id number] Date of Birth: April 29, 1956 Referring Provider:   Flowsheet Row Cardiac Rehab from 07/03/2023 in [facility] Cardiac [facility] Pul[facility] Rehab  Referring Provider Dr. Dorothyann Peng       Encounter Date: 09/23/2023  Check In:  Session Check In - 09/23/23 1132       Check-In   Supervising physician immediately available to respond to emergencies See telemetry face sheet for immediately available ER MD    Location [facility]-Cardiac & Pulmonary Rehab    Staff Present Elige Ko RCP,RRT,BSRT;Dajane Valli Jewel Baize RN,BSN;Kelly Madilyn Fireman BS, ACSM CEP, Exercise Physiologist    Virtual Visit No    Medication changes reported     No    Fall or balance concerns reported    No    Warm-up and Cool-down Performed on first and last piece of equipment    Resistance Training Performed Yes    VAD Patient? No    PAD/SET Patient? No      Pain Assessment   Currently in Pain? No/denies                Social History   Tobacco Use  Smoking Status Former   Current packs/day: 1.00   Types: Cigarettes  Smokeless Tobacco Never  Tobacco Comments   Smoked for about 30 years    Goals Met:  Independence with exercise equipment Exercise tolerated well No report of concerns or symptoms today Strength training completed today  Goals Unmet:  Not Applicable  Comments: Pt able to follow exercise prescription today without complaint.  Will continue to monitor for progression.    Dr. Bethann Punches is Medical Director for [facility] Cardiac Rehabilitation.  Dr. Vida Rigger is Medical Director for [facility] Pulmonary Rehabilitation.

## 2023-09-27 DIAGNOSIS — Z955 Presence of coronary angioplasty implant and graft: Secondary | ICD-10-CM

## 2023-09-27 NOTE — Progress Notes (Signed)
 Daily Session Note  Patient Details  Name: Damarea Merkel MRN: [id number] Date of Birth: [birth year] Referring Provider:   Flowsheet Row Cardiac Rehab from 07/03/2023 in [facility] Cardiac [facility] Re[facility]  Referring Provider Dr. Dorothyann Peng       Encounter Date: 09/27/2023  Check In:  Session Check In - 09/27/23 1059       Check-In   Supervising physician immediately available to respond to emergencies See telemetry face sheet for immediately available ER MD    Location [facility]-Cardiac & Pulmonary Rehab    Staff Present Kelton Pillar RN,BSN,MPA;Noah Tickle, BS, Exercise Physiologist;Joseph Hollace Kinnier    Virtual Visit No    Medication changes reported     No    Fall or balance concerns reported    No    Tobacco Cessation No Change    Warm-up and Cool-down Performed on first and last piece of equipment    Resistance Training Performed Yes    VAD Patient? No    PAD/SET Patient? No      Pain Assessment   Currently in Pain? No/denies                Social History   Tobacco Use  Smoking Status Former   Current packs/day: 1.00   Types: Cigarettes  Smokeless Tobacco Never  Tobacco Comments   Smoked for about 30 years    Goals Met:  Independence with exercise equipment Exercise tolerated well No report of concerns or symptoms today Strength training completed today  Goals Unmet:  Not Applicable  Comments: Pt able to follow exercise prescription today without complaint.  Will continue to monitor for progression.    Dr. Bethann Punches is Medical Director for [facility] Cardiac Rehabilitation.  Dr. Vida Rigger is Medical Director for [facility] Pulmonary Rehabilitation.

## 2023-09-30 ENCOUNTER — Encounter: Payer: BC Managed Care – PPO | Admitting: *Deleted

## 2023-09-30 DIAGNOSIS — Z955 Presence of coronary angioplasty implant and graft: Secondary | ICD-10-CM

## 2023-09-30 NOTE — Progress Notes (Signed)
 Daily Session Note  Patient Details  Name: Danny Brown MRN: [id number] Date of Birth: [birth year] Referring Provider:   Flowsheet Row Cardiac Rehab from 07/03/2023 in [facility] Rehab  Referring Provider Dr. Dorothyann Peng       Encounter Date: 09/30/2023  Check In:  Session Check In - 09/30/23 1134       Check-In   Supervising physician immediately available to respond to emergencies See telemetry face sheet for immediately available ER MD    Location [facility]-Cardiac & Pulmonary Rehab    Staff Present Cora Collum, RN, BSN, CCRP;Meredith Jewel Baize RN,BSN;Kelly [place] BS, ACSM CEP, Exercise Physiologist;Margaret Best, MS, Exercise Physiologist    Virtual Visit No    Medication changes reported     No    Fall or balance concerns reported    No    Warm-up and Cool-down Performed on first and last piece of equipment    Resistance Training Performed Yes    VAD Patient? No    PAD/SET Patient? No      Pain Assessment   Currently in Pain? No/denies                Social History   Tobacco Use  Smoking Status Former   Current packs/day: 1.00   Types: Cigarettes  Smokeless Tobacco Never  Tobacco Comments   Smoked for about 30 years    Goals Met:  Independence with exercise equipment Exercise tolerated well Personal goals reviewed No report of concerns or symptoms today  Goals Unmet:  Not Applicable  Comments: Pt able to follow exercise prescription today without complaint.    Danny Brown graduated today from  rehab with 35 sessions completed.  Details of the patient's exercise prescription and what He needs to do in order to continue the prescription and progress were discussed with patient.  Patient was given a copy of prescription and goals.  Patient verbalized understanding. Danny Brown plans to continue to exercise by working with home equipment, possibly will join a gym.  Dr. Bethann Punches is Medical Director for [facility] Cardiac Rehabilitation.  Dr. Vida Rigger is Medical Director for [facility] Pulmonary Rehabilitation.

## 2023-09-30 NOTE — Progress Notes (Signed)
 Cardiac Individual Treatment Plan  Patient Details  Name: Danny Brown MRN: [id number] Date of Birth: 1955-08-12 Referring Provider:   Flowsheet Row Cardiac Rehab from 07/03/2023 in [facility] Cardiac and Pulmonary Rehab  Referring Provider Dr. Dorothyann Peng       Initial Encounter Date:  Flowsheet Row Cardiac Rehab from 07/03/2023 in [facility] Cardiac and Pulmonary Rehab  Date 07/03/23       Visit Diagnosis: Status post coronary artery stent placement  Patient's Home Medications on Admission:  Current Outpatient Medications:    acetaminophen (TYLENOL) 500 MG tablet, Take 1,000 mg by mouth every 6 (six) hours as needed for moderate pain (pain score 4-6)., Disp: , Rfl:    alfuzosin (UROXATRAL) 10 MG 24 hr tablet, TAKE ONE TABLET BY MOUTH ONE TIME DAILY WITH BREAKFAST, Disp: 30 tablet, Rfl: 3   aspirin EC 81 MG tablet, Take 81 mg by mouth daily. Swallow whole., Disp: , Rfl:    atomoxetine (STRATTERA) 40 MG capsule, Take 40 mg by mouth every morning., Disp: , Rfl:    enalapril (VASOTEC) 2.5 MG tablet, Take 2.5 mg by mouth daily., Disp: , Rfl:    Enalapril-hydroCHLOROthiazide 5-12.5 MG tablet, SMARTSIG:1.0 Tablet(s) By Mouth Daily, Disp: , Rfl:    ezetimibe (ZETIA) 10 MG tablet, Take 10 mg by mouth daily., Disp: , Rfl:    L-METHYLFOLATE PO, Take 1 tablet by mouth daily., Disp: , Rfl:    metFORMIN (GLUCOPHAGE-XR) 750 MG 24 hr tablet, Take 750 mg by mouth 2 (two) times daily with a meal., Disp: , Rfl:    metoprolol succinate (TOPROL-XL) 25 MG 24 hr tablet, Take 25 mg by mouth daily., Disp: , Rfl:    pravastatin (PRAVACHOL) 40 MG tablet, Take 40 mg by mouth at bedtime., Disp: , Rfl:    tadalafil (CIALIS) 20 MG tablet, Take 1 tablet (20 mg total) by mouth daily as needed for erectile dysfunction., Disp: 90 tablet, Rfl: 3   tadalafil (CIALIS) 5 MG tablet, Take 1 tablet (5 mg total) by mouth daily as needed for erectile dysfunction. (Patient not taking: Reported on 07/01/2023), Disp: 90 tablet, Rfl:  3   ticagrelor (BRILINTA) 90 MG TABS tablet, Take 1 tablet (90 mg total) by mouth 2 (two) times daily., Disp: 60 tablet, Rfl: 0   Vilazodone HCl (VIIBRYD) 40 MG TABS, Take 40 mg by mouth daily., Disp: , Rfl:   Past Medical History: Past Medical History:  Diagnosis Date   Diabetes mellitus without complication (HCC)    Dyspnea    GERD (gastroesophageal reflux disease)    Gilbert syndrome    Heart murmur    Hypercholesterolemia    Hyperlipidemia    Hypertension    Hypochloremia    Sleep apnea     Tobacco Use: Social History   Tobacco Use  Smoking Status Former   Current packs/day: 1.00   Types: Cigarettes  Smokeless Tobacco Never  Tobacco Comments   Smoked for about 30 years    Labs: Review Flowsheet        No data to display           Exercise Target Goals: Exercise Program Goal: Individual exercise prescription set using results from initial 6 min walk test and THRR while considering  patient's activity barriers and safety.   Exercise Prescription Goal: Initial exercise prescription builds to 30-45 minutes a day of aerobic activity, 2-3 days per week.  Home exercise guidelines will be given to patient during program as part of exercise prescription that the participant will  acknowledge.   Education: Aerobic Exercise: - Group verbal and visual presentation on the components of exercise prescription. Introduces F.I.T.T principle from ACSM for exercise prescriptions.  Reviews F.I.T.T. principles of aerobic exercise including progression. Written material given at graduation. Flowsheet Row Cardiac Rehab from 08/07/2023 in [facility] Cardiac and Pulmonary Rehab  Education need identified 07/03/23       Education: Resistance Exercise: - Group verbal and visual presentation on the components of exercise prescription. Introduces F.I.T.T principle from ACSM for exercise prescriptions  Reviews F.I.T.T. principles of resistance exercise including progression. Written material  given at graduation.    Education: Exercise & Equipment Safety: - Individual verbal instruction and demonstration of equipment use and safety with use of the equipment. Flowsheet Row Cardiac Rehab from 08/07/2023 in [facility] Cardiac and Pulmonary Rehab  Date 07/03/23  Educator [facility]  Instruction Review Code 1- Verbalizes Understanding       Education: Exercise Physiology & General Exercise Guidelines: - Group verbal and written instruction with models to review the exercise physiology of the cardiovascular system and associated critical values. Provides general exercise guidelines with specific guidelines to those with heart or lung disease.    Education: Flexibility, Balance, Mind/Body Relaxation: - Group verbal and visual presentation with interactive activity on the components of exercise prescription. Introduces F.I.T.T principle from ACSM for exercise prescriptions. Reviews F.I.T.T. principles of flexibility and balance exercise training including progression. Also discusses the mind body connection.  Reviews various relaxation techniques to help reduce and manage stress (i.e. Deep breathing, progressive muscle relaxation, and visualization). Balance handout provided to take home. Written material given at graduation.   Activity Barriers & Risk Stratification:  Activity Barriers & Cardiac Risk Stratification - 07/03/23 1601       Activity Barriers & Cardiac Risk Stratification   Cardiac Risk Stratification Moderate             6 Minute Walk:  6 Minute Walk     Row Name 07/03/23 1559 09/20/23 1127       6 Minute Walk   Phase Initial Discharge    Distance 1335 feet 1545 feet    Distance % Change -- 15.7 %    Distance Feet Change -- 210 ft    Walk Time 6 minutes 6 minutes    # of Rest Breaks 0 0    MPH 2.5 2.93    METS 2.9 3.16    RPE 9 9    Perceived Dyspnea  0 0    VO2 Peak 10.3 11.07    Symptoms No No    Resting HR 74 bpm 87 bpm    Resting BP 94/56 96/54    Resting  Oxygen Saturation  97 % 99 %    Exercise Oxygen Saturation  during 6 min walk 96 % 98 %    Max Ex. HR 99 bpm 112 bpm    Max Ex. BP 128/64 108/60    2 Minute Post BP 112/64 --             Oxygen Initial Assessment:   Oxygen Re-Evaluation:   Oxygen Discharge (Final Oxygen Re-Evaluation):   Initial Exercise Prescription:  Initial Exercise Prescription - 07/03/23 1600       Date of Initial Exercise RX and Referring Provider   Date 07/03/23    Referring Provider Dr. Dorothyann Peng      Oxygen   Maintain Oxygen Saturation 88% or higher      Treadmill   MPH 2.3    Grade 1  Minutes 15    METs 3.08      Elliptical   Level 1    Speed 3    Minutes 15    METs 2.9      Biostep-RELP   Level 3    SPM 50    Minutes 15    METs 2.9      Intensity   THRR 40-80% of Max Heartrate 104-135    Ratings of Perceived Exertion 11-13    Perceived Dyspnea 0-4      Progression   Progression Continue to progress workloads to maintain intensity without signs/symptoms of physical distress.      Resistance Training   Training Prescription Yes    Weight 8lb    Reps 10-15             Perform Capillary Blood Glucose checks as needed.  Exercise Prescription Changes:   Exercise Prescription Changes     Row Name 07/03/23 1600 07/23/23 1400 08/08/23 1500 08/19/23 1100 08/20/23 0800     Response to Exercise   Blood Pressure (Admit) 94/56 130/72 124/58 -- 118/54   Blood Pressure (Exercise) 128/64 144/68 128/68 -- 150/80   Blood Pressure (Exit) 112/64 116/62 132/68 -- 106/58   Heart Rate (Admit) 74 bpm 110 bpm 73 bpm -- 89 bpm   Heart Rate (Exercise) 99 bpm 150 bpm 129 bpm -- 120 bpm   Heart Rate (Exit) 75 bpm 117 bpm 84 bpm -- 97 bpm   Oxygen Saturation (Admit) 97 % -- -- -- --   Oxygen Saturation (Exercise) 96 % -- -- -- --   Oxygen Saturation (Exit) 96 % -- -- -- --   Rating of Perceived Exertion (Exercise) 9 13 14  -- 14   Perceived Dyspnea (Exercise) 0 -- 0 -- 0    Symptoms none none none -- none   Comments results First two weeks of exercise -- -- --   Duration -- Continue with 30 min of aerobic exercise without signs/symptoms of physical distress. Continue with 30 min of aerobic exercise without signs/symptoms of physical distress. Continue with 30 min of aerobic exercise without signs/symptoms of physical distress. Continue with 30 min of aerobic exercise without signs/symptoms of physical distress.   Intensity -- THRR unchanged THRR unchanged THRR unchanged THRR unchanged     Progression   Progression -- Continue to progress workloads to maintain intensity without signs/symptoms of physical distress. Continue to progress workloads to maintain intensity without signs/symptoms of physical distress. Continue to progress workloads to maintain intensity without signs/symptoms of physical distress. Continue to progress workloads to maintain intensity without signs/symptoms of physical distress.   Average METs -- 3.26 3.55 3.55 3.64     Resistance Training   Training Prescription -- Yes Yes Yes Yes   Weight -- 8lb 8lb 8lb 8lb   Reps -- 10-15 10-15 10-15 10-15     Interval Training   Interval Training -- No No No No     Treadmill   MPH -- 3 2.8 2.8 2.8   Grade -- 2 2.5 2.5 2.5   Minutes -- 15 15 15 15    METs -- 4.12 4.11 4.11 4.11     Elliptical   Level -- 1 1 1 1    Speed -- 3 3.1 3.1 3.3   Minutes -- 15 15 15 15    METs -- 2.7 2.7 2.7 3     REL-XR   Level -- -- 4 4 --   Minutes -- -- 15 15 --  Home Exercise Plan   Plans to continue exercise at -- -- -- [employer] (comment)  senior center, walking, and recumbent bike [employer] (comment)  senior center, walking, and recumbent bike   Frequency -- -- -- Add 2 additional days to program exercise sessions. Add 2 additional days to program exercise sessions.   Initial Home Exercises Provided -- -- -- 08/19/23 08/19/23     Oxygen   Maintain Oxygen Saturation -- 88% or higher  88% or higher 88% or higher 88% or higher    Row Name 09/05/23 0700 09/19/23 1000           Response to Exercise   Blood Pressure (Admit) 110/60 124/62      Blood Pressure (Exit) 102/56 104/60      Heart Rate (Admit) 85 bpm 103 bpm      Heart Rate (Exercise) 139 bpm 152 bpm      Heart Rate (Exit) 105 bpm 115 bpm      Rating of Perceived Exertion (Exercise) 14 14      Symptoms none none      Duration Continue with 30 min of aerobic exercise without signs/symptoms of physical distress. Continue with 30 min of aerobic exercise without signs/symptoms of physical distress.      Intensity THRR unchanged THRR unchanged        Progression   Progression Continue to progress workloads to maintain intensity without signs/symptoms of physical distress. Continue to progress workloads to maintain intensity without signs/symptoms of physical distress.      Average METs 3.89 5.2        Resistance Training   Training Prescription Yes Yes      Weight 8 lb 8 lb      Reps 10-15 10-15        Interval Training   Interval Training No No        Treadmill   MPH 3.5 3.3      Grade 3.5 4.5      Minutes 15 15      METs 5.37 5.57        Elliptical   Level 4 6      Speed 3.6 3.5      Minutes 15 15      METs 4.6 5.6        Home Exercise Plan   Plans to continue exercise at [employer] (comment)  senior center, walking, and recumbent bike [employer] (comment)  senior center, walking, and recumbent bike      Frequency Add 2 additional days to program exercise sessions. Add 2 additional days to program exercise sessions.      Initial Home Exercises Provided 08/19/23 08/19/23        Oxygen   Maintain Oxygen Saturation 88% or higher 88% or higher               Exercise Comments:   Exercise Comments     Row Name 07/10/23 1725 09/30/23 1135         Exercise Comments First full day of exercise!  Patient was oriented to gym and equipment including functions, settings, policies,  and procedures.  Patient's individual exercise prescription and treatment plan were reviewed.  All starting workloads were established based on the results of the 6 minute walk test done at initial orientation visit.  The plan for exercise progression was also introduced and progression will be customized based on patient's performance and goals. Vonna Kotyk graduated today from  rehab with 35 sessions  completed.  Details of the patient's exercise prescription and what He needs to do in order to continue the prescription and progress were discussed with patient.  Patient was given a copy of prescription and goals.  Patient verbalized understanding. Terrion plans to continue to exercise by working with home equipment, possibly will join a gym.               Exercise Goals and Review:   Exercise Goals     Row Name 07/03/23 1609             Exercise Goals   Increase Physical Activity Yes       Intervention Develop an individualized exercise prescription for aerobic and resistive training based on initial evaluation findings, risk stratification, comorbidities and participant's personal goals.;Provide advice, education, support and counseling about physical activity/exercise needs.       Expected Outcomes Long Term: Exercising regularly at least 3-5 days a week.;Long Term: Add in home exercise to make exercise part of routine and to increase amount of physical activity.;Short Term: Attend rehab on a regular basis to increase amount of physical activity.       Increase Strength and Stamina Yes       Intervention Develop an individualized exercise prescription for aerobic and resistive training based on initial evaluation findings, risk stratification, comorbidities and participant's personal goals.;Provide advice, education, support and counseling about physical activity/exercise needs.       Expected Outcomes Long Term: Improve cardiorespiratory fitness, muscular endurance and strength as measured by  increased METs and functional capacity ( );Short Term: Perform resistance training exercises routinely during rehab and add in resistance training at home;Short Term: Increase workloads from initial exercise prescription for resistance, speed, and METs.       Able to understand and use rate of perceived exertion (RPE) scale Yes       Intervention Provide education and explanation on how to use RPE scale       Expected Outcomes Long Term:  Able to use RPE to guide intensity level when exercising independently;Short Term: Able to use RPE daily in rehab to express subjective intensity level       Able to understand and use Dyspnea scale Yes       Intervention Provide education and explanation on how to use Dyspnea scale       Expected Outcomes Long Term: Able to use Dyspnea scale to guide intensity level when exercising independently;Short Term: Able to use Dyspnea scale daily in rehab to express subjective sense of shortness of breath during exertion       Knowledge and understanding of Target Heart Rate Range (THRR) Yes       Intervention Provide education and explanation of THRR including how the numbers were predicted and where they are located for reference       Expected Outcomes Long Term: Able to use THRR to govern intensity when exercising independently;Short Term: Able to use daily as guideline for intensity in rehab;Short Term: Able to state/look up THRR       Able to check pulse independently Yes       Intervention Review the importance of being able to check your own pulse for safety during independent exercise;Provide education and demonstration on how to check pulse in carotid and radial arteries.       Expected Outcomes Long Term: Able to check pulse independently and accurately;Short Term: Able to explain why pulse checking is important during independent exercise  Understanding of Exercise Prescription Yes       Intervention Provide education, explanation, and written materials  on patient's individual exercise prescription       Expected Outcomes Long Term: Able to explain home exercise prescription to exercise independently;Short Term: Able to explain program exercise prescription                Exercise Goals Re-Evaluation :  Exercise Goals Re-Evaluation     Row Name 07/10/23 1728 07/23/23 1408 08/08/23 1545 08/19/23 1144 08/20/23 0804     Exercise Goal Re-Evaluation   Exercise Goals Review Knowledge and understanding of Target Heart Rate Range (THRR);Able to understand and use rate of perceived exertion (RPE) scale;Increase Physical Activity;Understanding of Exercise Prescription;Increase Strength and Stamina;Able to check pulse independently Increase Physical Activity;Increase Strength and Stamina;Understanding of Exercise Prescription Increase Physical Activity;Increase Strength and Stamina;Understanding of Exercise Prescription Increase Physical Activity;Able to understand and use rate of perceived exertion (RPE) scale;Knowledge and understanding of Target Heart Rate Range (THRR);Understanding of Exercise Prescription;Increase Strength and Stamina;Able to understand and use Dyspnea scale;Able to check pulse independently Increase Physical Activity;Increase Strength and Stamina;Understanding of Exercise Prescription   Comments Reviewed RPE and dyspnea scale, THR and program prescription with pt today.  Pt voiced understanding and was given a copy of goals to take home. Elza is off to a good start in the program. He did well during his first two weeks of rehab, improving his treadmill workload to a speed of 3 mph and an incline of 2%. He also did well on the elliptical at level 1. We will continue to monitor his progress in the program. Vonna Kotyk continues to do well in the program. He was able to increase his grade on the treadmill from 2% to 2.5%. He was also able to add the XR to his current exercise prescription at level 4. We will continue to monitor his progress in the  program. Reviewed home exercise with pt today.  Pt plans to walk, use recumbent bike, and join the senior center for exercise.  Reviewed THR, pulse, RPE, sign and symptoms, pulse oximetery and when to call 911 or MD.  Also discussed weather considerations and indoor options.  Pt voiced understanding. Vonna Kotyk is doing well in rehab. He was recently able to increase his speed on the elliptical from 3. to 3. at level 1. He was also able to maintain his workload on the treadmill at a speed of 2. and an incline of 2.5%. We will continue to monitor his progress in the program.   Expected Outcomes Short: Use RPE daily to regulate intensity.  Long: Follow program prescription in THR. Short: Continue to follow current exercise prescription. Long: Continue exercise to improve strength and stamina. Short: Continue to follow current exercise prescription. Long: Continue exercise to improve strength and stamina. Short: add 1-2 days of home exercise on off days of cardiac rehab. Long: become independent with exercise routine upon graduation from cardiac rehab. Short: Continue to increase treadmill and elliptical workloads. Long: Continue exercise to improve strength and stamina.    Row Name 09/05/23 0758 09/19/23 1041 09/30/23 1128         Exercise Goal Re-Evaluation   Exercise Goals Review Increase Physical Activity;Increase Strength and Stamina;Understanding of Exercise Prescription Increase Physical Activity;Increase Strength and Stamina;Understanding of Exercise Prescription Increase Physical Activity;Understanding of Exercise Prescription;Increase Strength and Stamina     Comments Vonna Kotyk continues to do well in rehab. He recently increased his treadmill workload to a speed of 3.5 mph  and an incline of 3.5%. He also increased from level 1 to level 4 on the elliptical. We will continue to monitor his progress in the program. Vonna Kotyk continues to do well in rehab. He increased his treadmill workload to a speed of 3.3  mph and an incline of 4.5%. He also improved to level 6 on the elliptical. He is due for his post and will look to improve on it. We will continue to monitor his progress in the program. Vonna Kotyk graduates from cardiac rehab today and he plans to continue his exercise at home with a stationary bike, walking, and using hand weights. He is also looking into joining a community fitness center.     Expected Outcomes Short: Continue to increase elliptical workload. Long: Continue exercise to improve strength and stamina. Short: Improve on post . Long: Continue exercise to improve strength and stamina. Short: graduate from cardaic rehab. Long: maintain independent exercise routine.              Discharge Exercise Prescription (Final Exercise Prescription Changes):  Exercise Prescription Changes - 09/19/23 1000       Response to Exercise   Blood Pressure (Admit) 124/62    Blood Pressure (Exit) 104/60    Heart Rate (Admit) 103 bpm    Heart Rate (Exercise) 152 bpm    Heart Rate (Exit) 115 bpm    Rating of Perceived Exertion (Exercise) 14    Symptoms none    Duration Continue with 30 min of aerobic exercise without signs/symptoms of physical distress.    Intensity THRR unchanged      Progression   Progression Continue to progress workloads to maintain intensity without signs/symptoms of physical distress.    Average METs 5.2      Resistance Training   Training Prescription Yes    Weight 8 lb    Reps 10-15      Interval Training   Interval Training No      Treadmill   MPH 3.3    Grade 4.5    Minutes 15    METs 5.57      Elliptical   Level 6    Speed 3.5    Minutes 15    METs 5.6      Home Exercise Plan   Plans to continue exercise at [employer] (comment)   senior center, walking, and recumbent bike   Frequency Add 2 additional days to program exercise sessions.    Initial Home Exercises Provided 08/19/23      Oxygen   Maintain Oxygen Saturation 88% or higher              Nutrition:  Target Goals: Understanding of nutrition guidelines, daily intake of sodium 1500mg , cholesterol 200mg , calories 30% from fat and 7% or less from saturated fats, daily to have 5 or more servings of fruits and vegetables.  Education: All About Nutrition: -Group instruction provided by verbal, written material, interactive activities, discussions, models, and posters to present general guidelines for heart healthy nutrition including fat, fiber, MyPlate, the role of sodium in heart healthy nutrition, utilization of the nutrition label, and utilization of this knowledge for meal planning. Follow up email sent as well. Written material given at graduation. Flowsheet Row Cardiac Rehab from 08/07/2023 in [facility] Cardiac and Pulmonary Rehab  Education need identified 07/03/23  Date 08/07/23  Educator JG  Instruction Review Code 1- Verbalizes Understanding       Biometrics:  Pre Biometrics - 07/03/23 1609  Pre Biometrics   Height 5' 3.58" (1.615 m)    Weight 156 lb 12.8 oz (71.1 kg)    Waist Circumference 38.5 inches    Hip Circumference 36 inches    Waist to Hip Ratio 1.07 %    BMI (Calculated) 27.27    Single Leg Stand 30 seconds             Post Biometrics - 09/20/23 1128        Post  Biometrics   Height 5' 3.58" (1.615 m)    Weight 151 lb 9.6 oz (68.8 kg)    Waist Circumference 37 inches    Hip Circumference 35 inches    Waist to Hip Ratio 1.06 %    BMI (Calculated) 26.36    Single Leg Stand 30 seconds             Nutrition Therapy Plan and Nutrition Goals:   Nutrition Assessments:  MEDIFICTS Score Key: >=70 Need to make dietary changes  40-70 Heart Healthy Diet <= 40 Therapeutic Level Cholesterol Diet  Flowsheet Row Cardiac Rehab from 09/23/2023 in [facility] Cardiac and Pulmonary Rehab  Picture Your Plate Total Score on Discharge 76      Picture Your Plate Scores: <16 Unhealthy dietary pattern with much room for  improvement. 41-50 Dietary pattern unlikely to meet recommendations for good health and room for improvement. 51-60 More healthful dietary pattern, with some room for improvement.  >60 Healthy dietary pattern, although there may be some specific behaviors that could be improved.    Nutrition Goals Re-Evaluation:  Nutrition Goals Re-Evaluation     Row Name 08/05/23 1130 09/30/23 1130           Goals   Comment Patient deferred individual RD appointment but plans to come to group education classes this week and next week. Patient deferred individual RD appointment but plans to come to group education classes this week and next week.               Nutrition Goals Discharge (Final Nutrition Goals Re-Evaluation):  Nutrition Goals Re-Evaluation - 09/30/23 1130       Goals   Comment Patient deferred individual RD appointment but plans to come to group education classes this week and next week.             Psychosocial: Target Goals: Acknowledge presence or absence of significant depression and/or stress, maximize coping skills, provide positive support system. Participant is able to verbalize types and ability to use techniques and skills needed for reducing stress and depression.   Education: Stress, Anxiety, and Depression - Group verbal and visual presentation to define topics covered.  Reviews how body is impacted by stress, anxiety, and depression.  Also discusses healthy ways to reduce stress and to treat/manage anxiety and depression.  Written material given at graduation.   Education: Sleep Hygiene -Provides group verbal and written instruction about how sleep can affect your health.  Define sleep hygiene, discuss sleep cycles and impact of sleep habits. Review good sleep hygiene tips.    Initial Review & Psychosocial Screening:  Initial Psych Review & Screening - 07/01/23 1114       Initial Review   Current issues with Current Psychotropic Meds;Current  Anxiety/Panic;Current Depression      Family Dynamics   Good Support System? Yes    Comments His depression is just "there" and worries about everything. He lives his wife that is a great support system. His daughters and his therapist are good supporters  also.      Barriers   Psychosocial barriers to participate in program The patient should benefit from training in stress management and relaxation.      Screening Interventions   Interventions Encouraged to exercise;To provide support and resources with identified psychosocial needs;Provide feedback about the scores to participant    Expected Outcomes Short Term goal: Utilizing psychosocial counselor, staff and physician to assist with identification of specific Stressors or current issues interfering with healing process. Setting desired goal for each stressor or current issue identified.;Long Term Goal: Stressors or current issues are controlled or eliminated.;Long Term goal: The participant improves quality of Life and PHQ9 Scores as seen by post scores and/or verbalization of changes;Short Term goal: Identification and review with participant of any Quality of Life or Depression concerns found by scoring the questionnaire.             Quality of Life Scores:   Quality of Life - 09/23/23 1133       Quality of Life   Select Quality of Life      Quality of Life Scores   Health/Function Pre 25.6 %    Health/Function Post 28.03 %    Health/Function % Change 9.49 %    Socioeconomic Pre 22.75 %    Socioeconomic Post 24.43 %    Socioeconomic % Change  7.38 %    Psych/Spiritual Pre 24.21 %    Psych/Spiritual Post 22.5 %    Psych/Spiritual % Change -7.06 %    Family Pre 27.6 %    Family Post 27.6 %    Family % Change 0 %    GLOBAL Pre 24.96 %    GLOBAL Post 26.2 %    GLOBAL % Change 4.97 %            Scores of 19 and below usually indicate a poorer quality of life in these areas.  A difference of  2-3 points is a clinically  meaningful difference.  A difference of 2-3 points in the total score of the Quality of Life Index has been associated with significant improvement in overall quality of life, self-image, physical symptoms, and general health in studies assessing change in quality of life.  PHQ-9: Review Flowsheet       09/23/2023 07/03/2023  Depression screen PHQ 2/9  Decreased Interest 0 0  Down, Depressed, Hopeless 0 1  PHQ - 2 Score 0 1  Altered sleeping 1 0  Tired, decreased energy 1 1  Change in appetite 0 0  Feeling bad or failure about yourself  0 0  Trouble concentrating 0 0  Moving slowly or fidgety/restless 0 1  Suicidal thoughts 0 0  PHQ-9 Score 2 3  Difficult doing work/chores Not difficult at all Not difficult at all   Interpretation of Total Score  Total Score Depression Severity:  1-4 = Minimal depression, 5-9 = Mild depression, 10-14 = Moderate depression, 15-19 = Moderately severe depression, 20-27 = Severe depression   Psychosocial Evaluation and Intervention:  Psychosocial Evaluation - 07/01/23 1116       Psychosocial Evaluation & Interventions   Interventions Relaxation education;Stress management education;Encouraged to exercise with the program and follow exercise prescription    Comments His depression is just "there" and worries about everything. He lives his wife that is a great support system. His daughters and his therapist are good supporters also.    Expected Outcomes Short: Start [facility] to help with mood. Long: Maintain a healthy mental state    Continue  Psychosocial Services  Follow up required by staff             Psychosocial Re-Evaluation:  Psychosocial Re-Evaluation     Row Name 08/05/23 1135             Psychosocial Re-Evaluation   Current issues with Current Psychotropic Meds;Current Depression;Current Anxiety/Panic       Comments Short: patient reports no changes or concern in mental health, sleep, or stress. He uses a CPAP to sleep, which  helps with sleep quality. He continues to have a good supports system.       Expected Outcomes Short: continue to attend cardiac rehab consistently for mental health benefits of exercise. Long: maintain good and stable mental health habits.       Interventions Encouraged to attend Cardiac Rehabilitation for the exercise       Continue Psychosocial Services  Follow up required by staff                Psychosocial Discharge (Final Psychosocial Re-Evaluation):  Psychosocial Re-Evaluation - 08/05/23 1135       Psychosocial Re-Evaluation   Current issues with Current Psychotropic Meds;Current Depression;Current Anxiety/Panic    Comments Short: patient reports no changes or concern in mental health, sleep, or stress. He uses a CPAP to sleep, which helps with sleep quality. He continues to have a good supports system.    Expected Outcomes Short: continue to attend cardiac rehab consistently for mental health benefits of exercise. Long: maintain good and stable mental health habits.    Interventions Encouraged to attend Cardiac Rehabilitation for the exercise    Continue Psychosocial Services  Follow up required by staff             Vocational Rehabilitation: Provide vocational rehab assistance to qualifying candidates.   Vocational Rehab Evaluation & Intervention:   Education: Education Goals: Education classes will be provided on a variety of topics geared toward better understanding of heart health and risk factor modification. Participant will state understanding/return demonstration of topics presented as noted by education test scores.  Learning Barriers/Preferences:  Learning Barriers/Preferences - 07/01/23 1113       Learning Barriers/Preferences   Learning Barriers None    Learning Preferences None             General Cardiac Education Topics:  AED/CPR: - Group verbal and written instruction with the use of models to demonstrate the basic use of the AED with the  basic ABC's of resuscitation.   Anatomy and Cardiac Procedures: - Group verbal and visual presentation and models provide information about basic cardiac anatomy and function. Reviews the testing methods done to diagnose heart disease and the outcomes of the test results. Describes the treatment choices: Medical Management, Angioplasty, or Coronary Bypass Surgery for treating various heart conditions including Myocardial Infarction, Angina, Valve Disease, and Cardiac Arrhythmias.  Written material given at graduation. Flowsheet Row Cardiac Rehab from 08/07/2023 in [facility] Cardiac and Pulmonary Rehab  Education need identified 07/03/23       Medication Safety: - Group verbal and visual instruction to review commonly prescribed medications for heart and lung disease. Reviews the medication, class of the drug, and side effects. Includes the steps to properly store meds and maintain the prescription regimen.  Written material given at graduation.   Intimacy: - Group verbal instruction through game format to discuss how heart and lung disease can affect sexual intimacy. Written material given at graduation..   Know Your Numbers and Heart Failure: - Group  verbal and visual instruction to discuss disease risk factors for cardiac and pulmonary disease and treatment options.  Reviews associated critical values for Overweight/Obesity, Hypertension, Cholesterol, and Diabetes.  Discusses basics of heart failure: signs/symptoms and treatments.  Introduces Heart Failure Zone chart for action plan for heart failure.  Written material given at graduation.   Infection Prevention: - Provides verbal and written material to individual with discussion of infection control including proper hand washing and proper equipment cleaning during exercise session. Flowsheet Row Cardiac Rehab from 08/07/2023 in [facility] Cardiac and Pulmonary Rehab  Date 07/03/23  Educator [facility]  Instruction Review Code 1- Verbalizes Understanding        Falls Prevention: - Provides verbal and written material to individual with discussion of falls prevention and safety. Flowsheet Row Cardiac Rehab from 08/07/2023 in [facility] Cardiac [facility] Pulmonary Rehab  Date 07/03/23  Educator [facility]  Instruction Review Code 1- Verbalizes Understanding       Other: -Provides group and verbal instruction on various topics (see comments)   Knowledge Questionnaire Score:  Knowledge Questionnaire Score - 09/23/23 1132       Knowledge Questionnaire Score   Post Score 25/26             Core Components/Risk Factors/Patient Goals at Admission:  Personal Goals and Risk Factors at Admission - 07/03/23 1606       Core Components/Risk Factors/Patient Goals on Admission    Weight Management Yes;Weight Loss    Intervention Weight Management: Develop a combined nutrition and exercise program designed to reach desired caloric intake, while maintaining appropriate intake of nutrient and fiber, sodium and fats, and appropriate energy expenditure required for the weight goal.;Weight Management: Provide education and appropriate resources to help participant work on and attain dietary goals.;Weight Management/Obesity: Establish reasonable short term and long term weight goals.    Admit Weight 156 lb 12.8 oz (71.1 kg)    Goal Weight: Short Term 150 lb (68 kg)    Goal Weight: Long Term 140 lb (63.5 kg)    Expected Outcomes Short Term: Continue to assess and modify interventions until short term weight is achieved;Long Term: Adherence to nutrition and physical activity/exercise program aimed toward attainment of established weight goal;Weight Loss: Understanding of general recommendations for a balanced deficit meal plan, which promotes 1-2 lb weight loss per week and includes a negative energy balance of [phone] kcal/d;Understanding recommendations for meals to include 15-35% energy as protein, 25-35% energy from fat, 35-60% energy from carbohydrates, less than  200mg  of dietary cholesterol, 20-35 gm of total fiber daily;Understanding of distribution of calorie intake throughout the day with the consumption of 4-5 meals/snacks    Diabetes Yes    Intervention Provide education about signs/symptoms and action to take for hypo/hyperglycemia.;Provide education about proper nutrition, including hydration, and aerobic/resistive exercise prescription along with prescribed medications to achieve blood glucose in normal ranges: Fasting glucose 65-99 mg/dL    Expected Outcomes Long Term: Attainment of HbA1C < 7%.;Short Term: Participant verbalizes understanding of the signs/symptoms and immediate care of hyper/hypoglycemia, proper foot care and importance of medication, aerobic/resistive exercise and nutrition plan for blood glucose control.    Hypertension Yes    Intervention Provide education on lifestyle modifcations including regular physical activity/exercise, weight management, moderate sodium restriction and increased consumption of fresh fruit, vegetables, and low fat dairy, alcohol moderation, and smoking cessation.;Monitor prescription use compliance.    Expected Outcomes Short Term: Continued assessment and intervention until BP is < 140/30mm HG in hypertensive participants. < 130/47mm HG  in hypertensive participants with diabetes, heart failure or chronic kidney disease.;Long Term: Maintenance of blood pressure at goal levels.    Lipids Yes    Intervention Provide education and support for participant on nutrition & aerobic/resistive exercise along with prescribed medications to achieve LDL 70mg , HDL >40mg .    Expected Outcomes Short Term: Participant states understanding of desired cholesterol values and is compliant with medications prescribed. Participant is following exercise prescription and nutrition guidelines.;Long Term: Cholesterol controlled with medications as prescribed, with individualized exercise RX and with personalized nutrition plan. Value  goals: LDL < 70mg , HDL > 40 mg.             Education:Diabetes - Individual verbal and written instruction to review signs/symptoms of diabetes, desired ranges of glucose level fasting, after meals and with exercise. Acknowledge that pre and post exercise glucose checks will be done for 3 sessions at entry of program. Flowsheet Row Cardiac Rehab from 08/07/2023 in [facility] Cardiac and Pulmonary Rehab  Date 07/03/23  Educator [facility]  Instruction Review Code 1- Verbalizes Understanding       Core Components/Risk Factors/Patient Goals Review:   Goals and Risk Factor Review     Row Name 08/05/23 1131 09/30/23 1130           Core Components/Risk Factors/Patient Goals Review   Personal Goals Review Weight Management/Obesity;Hypertension;Lipids;Diabetes Lipids;Hypertension;Diabetes      Review Patient reports that his weight has been steady. He also monitors blood pressure and blood sugar at home regularly. He reports is blood pressure is always good and his blood sugars are most of the time in acceptable ranges. He takes all his blood pressure, diabetes, and  cholesterol medication as prescribed and follows up with his doctor regularly for lab work to help manage his cardiac risk factors. Patient graduates from cardiac rehab today and reports that he feels comfortable checking BP and blood sugar at home. He also reports that he takes all his medications from blood pressure, cholesterol, and diabetes. He will continue to follow up with his doctor to manage these risk factors.      Expected Outcomes Short: continue to monitor blood pressure and blood sugars at home. Long: manage cardiac risk factors long term. Short: graduate from cardiac rehab. Long: continue to control cardiac risk factors with the help of his medical team.               Core Components/Risk Factors/Patient Goals at Discharge (Final Review):   Goals and Risk Factor Review - 09/30/23 1130       Core Components/Risk  Factors/Patient Goals Review   Personal Goals Review Lipids;Hypertension;Diabetes    Review Patient graduates from cardiac rehab today and reports that he feels comfortable checking BP and blood sugar at home. He also reports that he takes all his medications from blood pressure, cholesterol, and diabetes. He will continue to follow up with his doctor to manage these risk factors.    Expected Outcomes Short: graduate from cardiac rehab. Long: continue to control cardiac risk factors with the help of his medical team.             ITP Comments:  ITP Comments     Row Name 07/01/23 1112 07/03/23 1559 07/10/23 1155 07/10/23 1724 08/07/23 1313   ITP Comments Virtual Visit completed. Patient informed on EP and RD appointment and 6 Minute walk test. Patient also informed of patient health questionnaires on My Chart. Patient Verbalizes understanding. Visit diagnosis can be found in [facility] 06/19/2023. Completed  and gym orientation. Initial ITP created and sent for review to Dr. Bethann Punches, Medical Director. 30 Day review completed. Medical Director ITP review done, changes made as directed, and signed approval by Medical Director.    new to program First full day of exercise!  Patient was oriented to gym and equipment including functions, settings, policies, and procedures.  Patient's individual exercise prescription and treatment plan were reviewed.  All starting workloads were established based on the results of the 6 minute walk test done at initial orientation visit.  The plan for exercise progression was also introduced and progression will be customized based on patient's performance and goals. 30 Day review completed. Medical Director ITP review done, changes made as directed, and signed approval by Medical Director.    Row Name 09/04/23 0837 09/30/23 1135         ITP Comments 30 Day review completed. Medical Director ITP review done, changes made as directed, and signed approval by Medical  Director. Vonna Kotyk graduated today from  rehab with 35 sessions completed.  Details of the patient's exercise prescription and what He needs to do in order to continue the prescription and progress were discussed with patient.  Patient was given a copy of prescription and goals.  Patient verbalized understanding. Adarryl plans to continue to exercise by working with home equipment, possibly will join a gym.               Comments: Discharge ITP

## 2023-09-30 NOTE — Progress Notes (Signed)
 Discharge Note for  Danny Brown     1955/10/02         Vonna Kotyk graduated today from  rehab with 35 sessions completed.  Details of the patient's exercise prescription and what He needs to do in order to continue the prescription and progress were discussed with patient.  Patient was given a copy of prescription and goals.  Patient verbalized understanding. Doniven plans to continue to exercise by working with home equipment, possibly will join a gym.   6 Minute Walk     Row Name 07/03/23 1559 09/20/23 1127       6 Minute Walk   Phase Initial Discharge    Distance 1335 feet 1545 feet    Distance % Change -- 15.7 %    Distance Feet Change -- 210 ft    Walk Time 6 minutes 6 minutes    # of Rest Breaks 0 0    MPH 2.5 2.93    METS 2.9 3.16    RPE 9 9    Perceived Dyspnea  0 0    VO2 Peak 10.3 11.07    Symptoms No No    Resting HR 74 bpm 87 bpm    Resting BP 94/56 96/54    Resting Oxygen Saturation  97 % 99 %    Exercise Oxygen Saturation  during 6 min walk 96 % 98 %    Max Ex. HR 99 bpm 112 bpm    Max Ex. BP 128/64 108/60    2 Minute Post BP 112/64 --

## 2023-10-13 NOTE — Progress Notes (Unsigned)
 10/14/2023 5:34 PM   Danny Brown [birth year] [id number]  Referring provider: [facility] [address] [place],  [place] [address]  Urological history: 1. Erectile dysfunction -contributing factors of age, diabetes, HTN, HLD and BP meds.  -sildenafil - headaches  -tadalafil 20 mg, on-demand-dosing  2. BPH with LU TS -PSA (12/2022) - 0.6 -cysto (04/2023) -mild lateral lobe enlargement and mild elevation of the bladder neck -TRUS (04/2023) - 33 gram prostate   No chief complaint on file.  HPI: Danny Brown is a 68 y.o. male who presents today for pain in testicles/hip area, slow to start urination and not emptying bladder.  Previous records reviewed.    I PSS ***  PVR *** mL   UA ***       Score:  1-7 Mild 8-19 Moderate 20-35 Severe    PMH: Past Medical History:  Diagnosis Date   Diabetes mellitus without complication (HCC)    Dyspnea    GERD (gastroesophageal reflux disease)    Gilbert syndrome    Heart murmur    Hypercholesterolemia    Hyperlipidemia    Hypertension    Hypochloremia    Sleep apnea     Surgical History: Past Surgical History:  Procedure Laterality Date   CORONARY STENT INTERVENTION N/A 06/19/2023   Procedure: CORONARY STENT INTERVENTION;  Surgeon: Alwyn Pea, MD;  Location: [facility];  Service: Cardiovascular;  Laterality: N/A;   LEFT HEART CATH AND CORONARY ANGIOGRAPHY Left 06/19/2023   Procedure: LEFT HEART CATH AND CORONARY ANGIOGRAPHY;  Surgeon: Alwyn Pea, MD;  Location: [facility];  Service: Cardiovascular;  Laterality: Left;   PALATE / UVULA BIOPSY / EXCISION  07/14/2015   uvula removed   Uvula Removed      Home Medications:  Allergies as of 10/14/2023       Reactions   Atorvastatin Other (See Comments)   Joint pain   Penicillins Itching   Rosuvastatin Other (See Comments)   Joint pain        Medication List        Accurate as of October 13, 2023  5:34  PM. If you have any questions, ask your nurse or doctor.          acetaminophen 500 MG tablet Commonly known as: TYLENOL Take 1,000 mg by mouth every 6 (six) hours as needed for moderate pain (pain score 4-6).   alfuzosin 10 MG 24 hr tablet Commonly known as: UROXATRAL TAKE ONE TABLET BY MOUTH ONE TIME DAILY WITH BREAKFAST   aspirin EC 81 MG tablet Take 81 mg by mouth daily. Swallow whole.   atomoxetine 40 MG capsule Commonly known as: STRATTERA Take 40 mg by mouth every morning.   Brilinta 90 MG Tabs tablet Generic drug: ticagrelor Take 1 tablet (90 mg total) by mouth 2 (two) times daily.   enalapril 2.5 MG tablet Commonly known as: VASOTEC Take 2.5 mg by mouth daily.   Enalapril-hydroCHLOROthiazide 5-12.5 MG tablet SMARTSIG:1.0 Tablet(s) By Mouth Daily   ezetimibe 10 MG tablet Commonly known as: ZETIA Take 10 mg by mouth daily.   L-METHYLFOLATE PO Take 1 tablet by mouth daily.   metFORMIN 750 MG 24 hr tablet Commonly known as: GLUCOPHAGE-XR Take 750 mg by mouth 2 (two) times daily with a meal.   metoprolol succinate 25 MG 24 hr tablet Commonly known as: TOPROL-XL Take 25 mg by mouth daily.   pravastatin 40 MG tablet Commonly known as: PRAVACHOL Take 40 mg  by mouth at bedtime.   tadalafil 5 MG tablet Commonly known as: CIALIS Take 1 tablet (5 mg total) by mouth daily as needed for erectile dysfunction.   tadalafil 20 MG tablet Commonly known as: CIALIS Take 1 tablet (20 mg total) by mouth daily as needed for erectile dysfunction.   Vilazodone HCl 40 MG Tabs Commonly known as: VIIBRYD Take 40 mg by mouth daily.        Allergies:  Allergies  Allergen Reactions   Atorvastatin Other (See Comments)    Joint pain   Penicillins Itching   Rosuvastatin Other (See Comments)    Joint pain    Family History: No family history on file.  Social History:  reports that he has quit smoking. His smoking use included cigarettes. He has never used  smokeless tobacco. He reports current alcohol use. He reports that he does not use drugs.  ROS: Pertinent ROS in HPI  Physical Exam: There were no vitals taken for this visit.  Constitutional:  Well nourished. Alert and oriented, No acute distress. HEENT: [place] AT, moist mucus membranes.  Trachea midline, no masses. Cardiovascular: No clubbing, cyanosis, or edema. Respiratory: Normal respiratory effort, no increased work of breathing. GI: Abdomen is soft, non tender, non distended, no abdominal masses. Liver and spleen not palpable.  No hernias appreciated.  Stool sample for occult testing is not indicated.   GU: No CVA tenderness.  No bladder fullness or masses.  Patient with circumcised/uncircumcised phallus. ***Foreskin easily retracted***  Urethral meatus is patent.  No penile discharge. No penile lesions or rashes. Scrotum without lesions, cysts, rashes and/or edema.  Testicles are located scrotally bilaterally. No masses are appreciated in the testicles. Left and right epididymis are normal. Rectal: Patient with  normal sphincter tone. Anus and perineum without scarring or rashes. No rectal masses are appreciated. Prostate is approximately *** grams, *** nodules are appreciated. Seminal vesicles are normal. Skin: No rashes, bruises or suspicious lesions. Lymph: No cervical or inguinal adenopathy. Neurologic: Grossly intact, no focal deficits, moving all 4 extremities. Psychiatric: Normal mood and affect.   Laboratory Data: Urinalysis See EPIC and HPI I have reviewed the labs.  See HPI.      Pertinent Imaging: ***  Assessment & Plan:    1. BPH with severe LUTS -PSA stable  -PVR < 300 cc  -continue conservative management, avoiding bladder irritants and timed voiding's -Tadalafil 5 mg daily was not effective in reducing his symptoms -Continue alfuzosin 10 mg daily  2. ED -continue tadalafil 20 mg on-demand-dosing  3. Ejaculatory issues -no improvement with tadalafil      No follow-ups on file.  These notes generated with voice recognition software. I apologize for typographical errors.  Cloretta Ned  [facility] [address]  [address] [place], [place] [address] [phone]

## 2023-10-14 ENCOUNTER — Encounter: Payer: Self-pay | Admitting: Urology

## 2023-10-14 ENCOUNTER — Other Ambulatory Visit: Payer: Self-pay

## 2023-10-14 ENCOUNTER — Ambulatory Visit (INDEPENDENT_AMBULATORY_CARE_PROVIDER_SITE_OTHER): Admitting: Urology

## 2023-10-14 ENCOUNTER — Other Ambulatory Visit
Admission: RE | Admit: 2023-10-14 | Discharge: 2023-10-14 | Disposition: A | Discharge status: Home / Self Care | Attending: Urology | Admitting: Urology

## 2023-10-14 VITALS — BP 121/75 | HR 90 | Ht 63.0 in | Wt 153.0 lb

## 2023-10-14 DIAGNOSIS — N50819 Testicular pain, unspecified: Secondary | ICD-10-CM | POA: Insufficient documentation

## 2023-10-14 DIAGNOSIS — N5319 Other ejaculatory dysfunction: Secondary | ICD-10-CM | POA: Diagnosis not present

## 2023-10-14 DIAGNOSIS — N401 Enlarged prostate with lower urinary tract symptoms: Secondary | ICD-10-CM | POA: Diagnosis not present

## 2023-10-14 DIAGNOSIS — N50811 Right testicular pain: Secondary | ICD-10-CM | POA: Diagnosis not present

## 2023-10-14 DIAGNOSIS — N529 Male erectile dysfunction, unspecified: Secondary | ICD-10-CM | POA: Diagnosis not present

## 2023-10-14 DIAGNOSIS — N5089 Other specified disorders of the male genital organs: Secondary | ICD-10-CM

## 2023-10-14 LAB — URINALYSIS, COMPLETE (UACMP) WITH MICROSCOPIC
Bilirubin Urine: NEGATIVE
Glucose, UA: NEGATIVE mg/dL
Hgb urine dipstick: NEGATIVE
Ketones, ur: NEGATIVE mg/dL
Leukocytes,Ua: NEGATIVE
Nitrite: NEGATIVE
Protein, ur: NEGATIVE mg/dL
RBC / HPF: NONE SEEN RBC/hpf (ref 0–5)
Specific Gravity, Urine: <1.005 — ABNORMAL LOW (ref 1.005–1.030)
Squamous Epithelial / HPF: NONE SEEN /HPF (ref 0–5)
WBC, UA: NONE SEEN WBC/hpf (ref 0–5)
pH: 6.5 (ref 5.0–8.0)

## 2023-10-14 LAB — BLADDER SCAN AMB NON-IMAGING

## 2023-10-15 ENCOUNTER — Ambulatory Visit
Admission: RE | Admit: 2023-10-15 | Discharge: 2023-10-15 | Disposition: A | Discharge status: Home / Self Care | Source: Ambulatory Visit | Attending: Urology | Admitting: Urology

## 2023-10-15 DIAGNOSIS — N50811 Right testicular pain: Secondary | ICD-10-CM | POA: Insufficient documentation

## 2023-10-15 DIAGNOSIS — N5089 Other specified disorders of the male genital organs: Secondary | ICD-10-CM | POA: Insufficient documentation

## 2023-10-15 LAB — URINE CULTURE: Culture: NO GROWTH

## 2023-10-20 NOTE — Progress Notes (Unsigned)
 10/21/2023 6:10 PM   Danny Brown [birth year] [id number]  Referring provider: [facility] [address] [place],  [place] [address]  Urological history: 1. Erectile dysfunction -contributing factors of age, diabetes, HTN, HLD and BP meds.  -sildenafil - headaches  -tadalafil 20 mg, on-demand-dosing  2. BPH with LU TS -PSA (12/2022) - 0.6 -cysto (04/2023) -mild lateral lobe enlargement and mild elevation of the bladder neck -TRUS (04/2023) - 33 gram prostate   No chief complaint on file.  HPI: Danny Brown is a 68 y.o. male who presents today for scrotal ultrasound report.    Previous records reviewed.    At his visit on 10/14/2023, on Thursday, he had the sudden onset of severe bilateral testicular pain radiating to the perineum into the anus and rectum and throughout his hips.  The pain lasted pretty much all day Thursday and into Friday and then it suddenly abated.  He states for the last 2 months he has been having intermittent testicular pain that would be short-lived.  Patient denies any modifying or aggravating factors.  Patient denies any recent UTI's, gross hematuria, dysuria or suprapubic/flank pain.  Patient denies any fevers, chills, nausea or vomiting.  PVR 186 mL.  UA yellow clear, specific gravity less than 1.005, pH 6.5 and rare bacteria.  Urine culture negative.    Scrotal US (10/15/2023) -no torsion, no mass, small right varicocele, bilateral small hydrocele and right spermatic cord cyst.    PMH: Past Medical History:  Diagnosis Date   Diabetes mellitus without complication (HCC)    Dyspnea    GERD (gastroesophageal reflux disease)    Gilbert syndrome    Heart murmur    Hypercholesterolemia    Hyperlipidemia    Hypertension    Hypochloremia    Sleep apnea     Surgical History: Past Surgical History:  Procedure Laterality Date   CORONARY STENT INTERVENTION N/A 06/19/2023   Procedure: CORONARY STENT INTERVENTION;  Surgeon:  Alwyn Pea, MD;  Location: [facility];  Service: Cardiovascular;  Laterality: N/A;   LEFT HEART CATH AND CORONARY ANGIOGRAPHY Left 06/19/2023   Procedure: LEFT HEART CATH AND CORONARY ANGIOGRAPHY;  Surgeon: Alwyn Pea, MD;  Location: [facility];  Service: Cardiovascular;  Laterality: Left;   PALATE / UVULA BIOPSY / EXCISION  07/14/2015   uvula removed   Uvula Removed      Home Medications:  Allergies as of 10/21/2023       Reactions   Atorvastatin Other (See Comments)   Joint pain   Penicillins Itching   Rosuvastatin Other (See Comments)   Joint pain        Medication List        Accurate as of October 20, 2023  6:10 PM. If you have any questions, ask your nurse or doctor.          acetaminophen 500 MG tablet Commonly known as: TYLENOL Take 1,000 mg by mouth every 6 (six) hours as needed for moderate pain (pain score 4-6).   alfuzosin 10 MG 24 hr tablet Commonly known as: UROXATRAL TAKE ONE TABLET BY MOUTH ONE TIME DAILY WITH BREAKFAST   aspirin EC 81 MG tablet Take 81 mg by mouth daily. Swallow whole.   atomoxetine 40 MG capsule Commonly known as: STRATTERA Take 40 mg by mouth every morning.   Brilinta 90 MG Tabs tablet Generic drug: ticagrelor Take 1 tablet (90 mg total) by mouth 2 (two) times daily.   Contour  Next Test test strip Generic drug: glucose blood TEST BLOOD SUGAR TWICE A DAY   enalapril 2.5 MG tablet Commonly known as: VASOTEC Take 2.5 mg by mouth daily.   Enalapril-hydroCHLOROthiazide 5-12.5 MG tablet SMARTSIG:1.0 Tablet(s) By Mouth Daily   ezetimibe 10 MG tablet Commonly known as: ZETIA Take 10 mg by mouth daily.   L-METHYLFOLATE PO Take 1 tablet by mouth daily.   metFORMIN 750 MG 24 hr tablet Commonly known as: GLUCOPHAGE-XR Take 750 mg by mouth 2 (two) times daily with a meal.   metoprolol succinate 25 MG 24 hr tablet Commonly known as: TOPROL-XL Take 25 mg by mouth daily.   pravastatin 40  MG tablet Commonly known as: PRAVACHOL Take 40 mg by mouth at bedtime.   tadalafil 5 MG tablet Commonly known as: CIALIS Take 1 tablet (5 mg total) by mouth daily as needed for erectile dysfunction.   tadalafil 20 MG tablet Commonly known as: CIALIS Take 1 tablet (20 mg total) by mouth daily as needed for erectile dysfunction.   Vilazodone HCl 40 MG Tabs Commonly known as: VIIBRYD Take 40 mg by mouth daily.        Allergies:  Allergies  Allergen Reactions   Atorvastatin Other (See Comments)    Joint pain   Penicillins Itching   Rosuvastatin Other (See Comments)    Joint pain    Family History: No family history on file.  Social History:  reports that he has quit smoking. His smoking use included cigarettes. He has never used smokeless tobacco. He reports current alcohol use. He reports that he does not use drugs.  ROS: Pertinent ROS in HPI  Physical Exam: There were no vitals taken for this visit.  Constitutional:  Well nourished. Alert and oriented, No acute distress. HEENT:  AT, moist mucus membranes.  Trachea midline, no masses. Cardiovascular: No clubbing, cyanosis, or edema. Respiratory: Normal respiratory effort, no increased work of breathing. GI: Abdomen is soft, non tender, non distended, no abdominal masses. Liver and spleen not palpable.  No hernias appreciated.  Stool sample for occult testing is not indicated.   GU: No CVA tenderness.  No bladder fullness or masses.  Patient with circumcised/uncircumcised phallus. ***Foreskin easily retracted***  Urethral meatus is patent.  No penile discharge. No penile lesions or rashes. Scrotum without lesions, cysts, rashes and/or edema.  Testicles are located scrotally bilaterally. No masses are appreciated in the testicles. Left and right epididymis are normal. Rectal: Patient with  normal sphincter tone. Anus and perineum without scarring or rashes. No rectal masses are appreciated. Prostate is approximately ***  grams, *** nodules are appreciated. Seminal vesicles are normal. Skin: No rashes, bruises or suspicious lesions. Lymph: No cervical or inguinal adenopathy. Neurologic: Grossly intact, no focal deficits, moving all 4 extremities. Psychiatric: Normal mood and affect.   Laboratory Data: N/A    Pertinent Imaging: Narrative & Impression  CLINICAL DATA:  Right spermatic cord mass and right testicular pain   EXAM: SCROTAL ULTRASOUND   DOPPLER ULTRASOUND OF THE TESTICLES   TECHNIQUE: Complete ultrasound examination of the testicles, epididymis, and other scrotal structures was performed. Color and spectral Doppler ultrasound were also utilized to evaluate blood flow to the testicles.   COMPARISON:  None Available.   FINDINGS: Right testicle   Measurements: 3.4 x 2.2 x 3.2 cm. No mass or microlithiasis visualized.   Left testicle   Measurements: 3.9 x 2.2 x 3.1 cm. No mass or microlithiasis visualized.   Right epididymis:  0.8 x 0.7 x  0.6 cm   Left epididymis: 1.0 x 2.8 by 1.3 cm subcentimeter right for spermatic cord cyst   Hydrocele:  Very small hydroceles   Varicocele:  Small right varicocele   Pulsed Doppler interrogation of both testes demonstrates normal low resistance arterial and venous waveforms bilaterally.   IMPRESSION: *No evidence of testicular torsion or mass. *Small right varicocele. *Very small hydroceles. *Subcentimeter right spermatic cord cyst.   Right spermatic cord mass right testicular pain please pick correct template.     Electronically Signed   By: Shaaron Adler M.D.   On: 10/18/2023 14:16  I have independently reviewed the films.  See HPI.     Assessment & Plan:    1. BPH with LUTS -PSA stable  -PVR < 300 cc  -continue conservative management, avoiding bladder irritants and timed voiding's -Continue alfuzosin 10 mg daily  2. ED -continue tadalafil 20 mg on-demand-dosing  3. 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  These notes generated with voice recognition software. I apologize for typographical errors.  Cloretta Ned  [facility] [address]  [address] [place], [place] [address] [phone]

## 2023-10-21 ENCOUNTER — Ambulatory Visit (INDEPENDENT_AMBULATORY_CARE_PROVIDER_SITE_OTHER): Admitting: Urology

## 2023-10-21 ENCOUNTER — Encounter: Payer: Self-pay | Admitting: Urology

## 2023-10-21 VITALS — BP 104/65 | HR 83 | Ht 63.0 in | Wt 148.1 lb

## 2023-10-21 DIAGNOSIS — N5089 Other specified disorders of the male genital organs: Secondary | ICD-10-CM | POA: Diagnosis not present

## 2024-02-16 ENCOUNTER — Emergency Department
Admission: EM | Admit: 2024-02-16 | Discharge: 2024-02-16 | Disposition: A | Discharge status: Home / Self Care | Attending: Emergency Medicine | Admitting: Emergency Medicine

## 2024-02-16 ENCOUNTER — Other Ambulatory Visit: Payer: Self-pay

## 2024-02-16 ENCOUNTER — Emergency Department

## 2024-02-16 DIAGNOSIS — N132 Hydronephrosis with renal and ureteral calculous obstruction: Secondary | ICD-10-CM | POA: Diagnosis not present

## 2024-02-16 DIAGNOSIS — N200 Calculus of kidney: Secondary | ICD-10-CM

## 2024-02-16 DIAGNOSIS — I10 Essential (primary) hypertension: Secondary | ICD-10-CM | POA: Diagnosis not present

## 2024-02-16 DIAGNOSIS — R1032 Left lower quadrant pain: Secondary | ICD-10-CM | POA: Diagnosis present

## 2024-02-16 DIAGNOSIS — R911 Solitary pulmonary nodule: Secondary | ICD-10-CM | POA: Diagnosis not present

## 2024-02-16 DIAGNOSIS — E119 Type 2 diabetes mellitus without complications: Secondary | ICD-10-CM | POA: Insufficient documentation

## 2024-02-16 LAB — CBC WITH DIFFERENTIAL/PLATELET
Abs Immature Granulocytes: 0.07 K/uL (ref 0.00–0.07)
Basophils Absolute: 0.1 K/uL (ref 0.0–0.1)
Basophils Relative: 1 %
Eosinophils Absolute: 0.2 K/uL (ref 0.0–0.5)
Eosinophils Relative: 1 %
HCT: 39.2 % (ref 39.0–52.0)
Hemoglobin: 14 g/dL (ref 13.0–17.0)
Immature Granulocytes: 1 %
Lymphocytes Relative: 17 %
Lymphs Abs: 1.7 K/uL (ref 0.7–4.0)
MCH: 32.4 pg (ref 26.0–34.0)
MCHC: 35.7 g/dL (ref 30.0–36.0)
MCV: 90.7 fL (ref 80.0–100.0)
Monocytes Absolute: 0.5 K/uL (ref 0.1–1.0)
Monocytes Relative: 4 %
Neutro Abs: 8.1 K/uL — ABNORMAL HIGH (ref 1.7–7.7)
Neutrophils Relative %: 76 %
Platelets: 276 K/uL (ref 150–400)
RBC: 4.32 MIL/uL (ref 4.22–5.81)
RDW: 11.9 % (ref 11.5–15.5)
WBC: 10.5 K/uL (ref 4.0–10.5)
nRBC: 0 % (ref 0.0–0.2)

## 2024-02-16 LAB — COMPREHENSIVE METABOLIC PANEL WITH GFR
ALT: 29 U/L (ref 0–44)
AST: 24 U/L (ref 15–41)
Albumin: 4.1 g/dL (ref 3.5–5.0)
Alkaline Phosphatase: 76 U/L (ref 38–126)
Anion gap: 12 (ref 5–15)
BUN: 21 mg/dL (ref 8–23)
CO2: 24 mmol/L (ref 22–32)
Calcium: 9.5 mg/dL (ref 8.9–10.3)
Chloride: 101 mmol/L (ref 98–111)
Creatinine, Ser: 1.13 mg/dL (ref 0.61–1.24)
GFR, Estimated: >60 mL/min (ref >60)
Glucose, Bld: 203 mg/dL — ABNORMAL HIGH (ref 70–99)
Potassium: 4.4 mmol/L (ref 3.5–5.1)
Sodium: 137 mmol/L (ref 135–145)
Total Bilirubin: 0.8 mg/dL (ref 0.0–1.2)
Total Protein: 6.9 g/dL (ref 6.5–8.1)

## 2024-02-16 LAB — URINALYSIS, ROUTINE W REFLEX MICROSCOPIC
Bacteria, UA: NONE SEEN
Bilirubin Urine: NEGATIVE
Glucose, UA: 150 mg/dL — AB
Ketones, ur: NEGATIVE mg/dL
Leukocytes,Ua: NEGATIVE
Nitrite: NEGATIVE
Protein, ur: 30 mg/dL — AB
RBC / HPF: >50 RBC/hpf (ref 0–5)
Specific Gravity, Urine: 1.015 (ref 1.005–1.030)
Squamous Epithelial / HPF: 0 /HPF (ref 0–5)
pH: 7 (ref 5.0–8.0)

## 2024-02-16 MED ORDER — ONDANSETRON 4 MG PO TBDP: 4.0000 mg | ORAL_TABLET | Freq: Four times a day (QID) | ORAL | 0 refills | Status: DC | PRN

## 2024-02-16 MED ORDER — KETOROLAC TROMETHAMINE 30 MG/ML IJ SOLN
30.0000 mg | Freq: Once | INTRAMUSCULAR | Status: AC
  Administered 2024-02-16: 30 mg via INTRAMUSCULAR
  Filled 2024-02-16: qty 1

## 2024-02-16 MED ORDER — TAMSULOSIN HCL 0.4 MG PO CAPS: 0.4000 mg | ORAL_CAPSULE | Freq: Every day | ORAL | 0 refills | Status: DC

## 2024-02-16 MED ORDER — HYDROCODONE-ACETAMINOPHEN 5-325 MG PO TABS: 1.0000 | ORAL_TABLET | Freq: Four times a day (QID) | ORAL | 0 refills | Status: DC | PRN

## 2024-02-16 MED ORDER — CEPHALEXIN 500 MG PO CAPS: 500.0000 mg | ORAL_CAPSULE | Freq: Four times a day (QID) | ORAL | 0 refills | Status: DC

## 2024-02-16 NOTE — Discharge Instructions (Signed)
 No driving within 8 hour of use of hydrocodone .

## 2024-02-16 NOTE — ED Provider Notes (Signed)
 [facility] Provider Note    Event Date/Time   First MD Initiated Contact with Patient 02/16/24 [phone]     (approximate)   History   Abdominal Pain and Flank Pain   HPI  Danny Brown is a 68 y.o. male history of diabetes hypertension cardiac disease  At about 4 AM patient awoke with sudden severe pain in his lower abdomen, it was radiating towards his left flank.  He reports that it was very severe he thought he might be passing a kidney stone as his daughter and other members of family have had similar experiences.  He was very nauseated.  He reports that since, after receiving Toradol , his pain is gone away completely and he feels fine now.  No fevers or chills.  No blood in his urine.  Does follow with Dr. Josefine for prostate issues      Physical Exam   Triage Vital Signs: ED Triage Vitals  Encounter Vitals Group     BP 02/16/24 0615 (!) 160/85     Girls Systolic BP Percentile --      Girls Diastolic BP Percentile --      Boys Systolic BP Percentile --      Boys Diastolic BP Percentile --      Pulse Rate 02/16/24 0615 63     Resp 02/16/24 0615 20     Temp 02/16/24 0615 98.2 F (36.8 C)     Temp src --      SpO2 02/16/24 0615 100 %     Weight 02/16/24 0617 145 lb (65.8 kg)     Height 02/16/24 0617 5' 3 (1.6 m)     Head Circumference --      Peak Flow --      Pain Score 02/16/24 0625 10     Pain Loc --      Pain Education --      Exclude from Growth Chart --     Most recent vital signs: Vitals:   02/16/24 0615  BP: (!) 160/85  Pulse: 63  Resp: 20  Temp: 98.2 F (36.8 C)  SpO2: 100%     General: Awake, no distress.  CV:  Good peripheral perfusion.  Resp:  Normal effort.  Abd:  No distention.  Soft nontender nondistended.  Mild tenderness to percussion left costovertebral angle Other:     ED Results / Procedures / Treatments   Labs (all labs ordered are listed, but only abnormal results are displayed) Labs Reviewed   COMPREHENSIVE METABOLIC PANEL WITH GFR - Abnormal; Notable for the following components:      Result Value   Glucose, Bld 203 (*)    All other components within normal limits  CBC WITH DIFFERENTIAL/PLATELET - Abnormal; Notable for the following components:   Neutro Abs 8.1 (*)    All other components within normal limits  URINALYSIS, ROUTINE W REFLEX MICROSCOPIC - Abnormal; Notable for the following components:   Color, Urine YELLOW (*)    APPearance HAZY (*)    Glucose, UA 150 (*)    Hgb urine dipstick LARGE (*)    Protein, ur 30 (*)    All other components within normal limits  URINE CULTURE     EKG     RADIOLOGY   CT imaging grossly inter by me as notable perinephric stranding and large proximal left-sided kidney stone  CT Renal Stone Study Result Date: 02/16/2024 CLINICAL DATA:  Abdominal pain. Left-sided flank pain. History of kidney stones. EXAM: CT  ABDOMEN AND PELVIS WITHOUT CONTRAST TECHNIQUE: Multidetector CT imaging of the abdomen and pelvis was performed following the standard protocol without IV contrast. RADIATION DOSE REDUCTION: This exam was performed according to the departmental dose-optimization program which includes automated exposure control, adjustment of the mA and/or kV according to patient size and/or use of iterative reconstruction technique. COMPARISON:  07/20/2011 FINDINGS: Lower chest: Tiny 2-3 mm pulmonary nodules are seen in the right lower and right middle lobes. The right middle lobe nodule is stable since 2012 consistent with benign etiology. The right lower lobe nodule is new in the interval since that study and also comparing to CT a chest 06/11/2023. Hepatobiliary: No suspicious focal abnormality in the liver on this study without intravenous contrast. There is no evidence for gallstones, gallbladder wall thickening, or pericholecystic fluid. No intrahepatic or extrahepatic biliary dilation. Pancreas: No focal mass lesion. No dilatation of the main  duct. No intraparenchymal cyst. No peripancreatic edema. Spleen: No splenomegaly. No suspicious focal mass lesion. Adrenals/Urinary Tract: No adrenal nodule or mass. Right kidney and ureter unremarkable. Punctate nonobstructing stone identified upper pole left kidney. Mild to moderate left hydronephrosis associated with prominent left perinephric edema. These changes are secondary to the presence of a 9 x 6 x 7 mm left UPJ stone (36/2). Left ureter otherwise unremarkable. The urinary bladder appears normal for the degree of distention. Stomach/Bowel: Tiny hiatal hernia. Stomach otherwise unremarkable. Duodenum is normally positioned as is the ligament of Treitz. No small bowel wall thickening. No small bowel dilatation. The terminal ileum is normal. The appendix is normal. No gross colonic mass. No colonic wall thickening. Vascular/Lymphatic: There is mild atherosclerotic calcification of the abdominal aorta without aneurysm. There is no gastrohepatic or hepatoduodenal ligament lymphadenopathy. No retroperitoneal or mesenteric lymphadenopathy. No pelvic sidewall lymphadenopathy. Reproductive: The prostate gland and seminal vesicles are unremarkable. Other: No intraperitoneal free fluid. Musculoskeletal: No worrisome lytic or sclerotic osseous abnormality. IMPRESSION: 1. 9 x 6 x 7 mm left UPJ stone with mild to moderate left hydronephrosis and prominent left perinephric edema. 2. Punctate nonobstructing stone upper pole left kidney. 3. Tiny hiatal hernia. 4. Tiny 2-3 mm right lower lobe nodule is new in the interval since CT chest 06/11/2023. No follow-up needed if patient is low-risk.This recommendation follows the consensus statement: Guidelines for Management of Incidental Pulmonary Nodules Detected on CT Images: From the Fleischner Society 2017; Radiology 2017; 284:228-243. 5.  Aortic Atherosclerosis (ICD10-I70.0). Electronically Signed   By: Camellia Candle M.D.   On: 02/16/2024 07:07        PROCEDURES:  Critical Care performed: No  Procedures   MEDICATIONS ORDERED IN ED: Medications  ketorolac  (TORADOL ) 30 MG/ML injection 30 mg (30 mg Intramuscular Given 02/16/24 0625)     IMPRESSION / MDM / ASSESSMENT AND PLAN / ED COURSE  I reviewed the triage vital signs and the nursing notes.                              Differential diagnosis includes but is not limited to, abdominal perforation, aortic dissection, cholecystitis, appendicitis, diverticulitis, colitis, esophagitis/gastritis, kidney stone, pyelonephritis, urinary tract infection, aortic aneurysm. All are considered in decision and treatment plan. Based upon the patient's presentation and risk factors, given the acuity of symptoms and history highly suspicious for nephrolithiasis and based on the patient's CT scan he does show fairly large acute left-sided nephrolithiasis fairly proximal ureter.  His pain is extremely well-controlled and has gone away completely  after Toradol  which is reassuring but due to the size and degree of his stone have consulted with our urologist.  He does not have any signs or symptoms suggestive of sepsis or concomitant infection.  Currently awaiting urinalysis.  Urinalysis demonstrate no leukocytes negative for nitrates.  No clear evidence of infection.  Discussed with Dr. Suann., he does advise would be reasonable to start patient on cephalexin    Patient's presentation is most consistent with acute complicated illness / injury requiring diagnostic workup.         floxmax, keflex , per urology.  Hydrocodone  for pain control which discussed with the patient he will use cautiously only as prescribed and not drive while taking.  Additionally will prescribe Zofran  as needed.  ----------------------------------------- 9:27 AM on 02/16/2024 ----------------------------------------- Pain and symptom-free at this time.  Have discussed with Dr. Nieves who has reviewed CT imaging  clinical history labs etc. and advises close outpatient follow-up.  Think this is agreeable as well given the good pain control.  Vital signs normal  Return precautions and treatment recommendations and follow-up discussed with the patient who is agreeable with the plan.   FINAL CLINICAL IMPRESSION(S) / ED DIAGNOSES   Final diagnoses:  Kidney stone on left side  Pulmonary nodule     Rx / DC Orders   ED Discharge Orders          Ordered    AMB  Referral to Pulmonary Nodule Clinic        02/16/24 0751    ondansetron  (ZOFRAN -ODT) 4 MG disintegrating tablet  Every 6 hours PRN        02/16/24 0925    HYDROcodone -acetaminophen  (NORCO/VICODIN) 5-325 MG tablet  Every 6 hours PRN        02/16/24 0925    tamsulosin  (FLOMAX ) 0.4 MG CAPS capsule  Daily        02/16/24 0925    cephALEXin  (KEFLEX ) 500 MG capsule  4 times daily        02/16/24 9074    Ambulatory referral to Urology        02/16/24 9073             Note:  This document was prepared using Dragon voice recognition software and may include unintentional dictation errors.   Dicky Anes, MD 02/16/24 [phone]

## 2024-02-16 NOTE — ED Triage Notes (Signed)
 Patient wheeled to triage with complaints of lower abdominal and left sided flank pain that started around 2am. Denies previous hx of kidney stones but states many in his family has had them.

## 2024-02-16 NOTE — ED Notes (Signed)
 Pt stating pain is doing much better after meds given. Stating pain was across lower abdomen which then radiated into left back.

## 2024-02-17 LAB — URINE CULTURE: Culture: NO GROWTH

## 2024-02-20 ENCOUNTER — Ambulatory Visit: Admitting: Physician Assistant

## 2024-02-20 ENCOUNTER — Telehealth: Payer: Self-pay | Admitting: Urology

## 2024-02-20 ENCOUNTER — Encounter: Payer: Self-pay | Admitting: Physician Assistant

## 2024-02-20 VITALS — BP 147/83 | HR 96 | Temp 97.5°F | Ht 63.0 in | Wt 149.4 lb

## 2024-02-20 DIAGNOSIS — N201 Calculus of ureter: Secondary | ICD-10-CM | POA: Diagnosis not present

## 2024-02-20 DIAGNOSIS — N138 Other obstructive and reflux uropathy: Secondary | ICD-10-CM

## 2024-02-20 LAB — URINALYSIS, COMPLETE
Bilirubin, UA: NEGATIVE
Glucose, UA: NEGATIVE
Leukocytes,UA: NEGATIVE
Nitrite, UA: NEGATIVE
Protein,UA: NEGATIVE
RBC, UA: NEGATIVE
Specific Gravity, UA: 1.02 (ref 1.005–1.030)
Urobilinogen, Ur: 0.2 mg/dL (ref 0.2–1.0)
pH, UA: 6 (ref 5.0–7.5)

## 2024-02-20 LAB — MICROSCOPIC EXAMINATION

## 2024-02-20 MED ORDER — TAMSULOSIN HCL 0.4 MG PO CAPS: 0.4000 mg | ORAL_CAPSULE | Freq: Every day | ORAL | 0 refills | Status: DC

## 2024-02-20 MED ORDER — HYDROCODONE-ACETAMINOPHEN 5-325 MG PO TABS: 1.0000 | ORAL_TABLET | ORAL | 0 refills | Status: DC | PRN

## 2024-02-20 MED ORDER — KETOROLAC TROMETHAMINE 60 MG/2ML IM SOLN
15.0000 mg | Freq: Once | INTRAMUSCULAR | Status: AC
  Administered 2024-02-20: 15 mg via INTRAMUSCULAR

## 2024-02-20 NOTE — Telephone Encounter (Signed)
 Patient called to report that their prescription for HYDROcodone -acetaminophen  is nearly out and is requesting a refill to be sent to their pharmacy: [facility] [address] in [place]. The patient is also requesting a call back for clarification.

## 2024-02-20 NOTE — Telephone Encounter (Signed)
 error

## 2024-02-20 NOTE — Patient Instructions (Signed)
 We are planning for left ureteroscopy with laser lithotripsy and stent placement with Dr. Francisca on Monday, 8/4. In the meantime: -Take Flomax  0.4mg  daily -Treat any pain with Advil (ibuprofen) or Norco. You may take up to 2 Norco every 4 hours for pain. -Treat any nausea with Zofran   Please call our office immediately (we are open 8a-5p Monday-Friday) or go to the Emergency Department if you develop any of the following: -Fever/chills -Nausea and/or vomiting uncontrollable with Zofran  -Pain uncontrollable with Norco

## 2024-02-21 ENCOUNTER — Other Ambulatory Visit: Payer: Self-pay

## 2024-02-21 DIAGNOSIS — R911 Solitary pulmonary nodule: Secondary | ICD-10-CM

## 2024-02-21 DIAGNOSIS — N201 Calculus of ureter: Secondary | ICD-10-CM

## 2024-02-21 HISTORY — DX: Solitary pulmonary nodule: R91.1

## 2024-02-21 HISTORY — DX: Calculus of ureter: N20.1

## 2024-02-21 NOTE — H&P (View-Only) (Signed)
 02/20/2024 9:46 PM   Danny Brown 08/12/1955 [id number]  CC: Chief Complaint  Patient presents with   Nephrolithiasis   HPI: Danny Brown is a 68 y.o. male with PMH diabetes, ED on demand dose tadalafil  and BPH who presents today for ED follow up of a 9mm left UPJ stone. He is accompanied today by his wife.  He was seen in our ED on 7/27 with reports of left flank and LLQ pain. CT stone study showed a 9mm left UPJ stone and punctate left upper pole stone.  UA showed >50 RBCs/hpf and 21-50 WBCs/hpf; urine culture negative.  Today he reports ongoing severe left flank pain but no fevers. This is his first stone episode, though he has a family history of kidney stones.  In-office UA today positive for trace ketones; urine microscopy with 3-10 RBCs/HPF.  PMH: Past Medical History:  Diagnosis Date   Diabetes mellitus without complication (HCC)    Dyspnea    GERD (gastroesophageal reflux disease)    Gilbert syndrome    Heart murmur    Hypercholesterolemia    Hyperlipidemia    Hypertension    Hypochloremia    Sleep apnea     Surgical History: Past Surgical History:  Procedure Laterality Date   CORONARY STENT INTERVENTION N/A 06/19/2023   Procedure: CORONARY STENT INTERVENTION;  Surgeon: Florencio Cara BIRCH, MD;  Location: [facility];  Service: Cardiovascular;  Laterality: N/A;   LEFT HEART CATH AND CORONARY ANGIOGRAPHY Left 06/19/2023   Procedure: LEFT HEART CATH AND CORONARY ANGIOGRAPHY;  Surgeon: Florencio Cara BIRCH, MD;  Location: [facility];  Service: Cardiovascular;  Laterality: Left;   PALATE / UVULA BIOPSY / EXCISION  07/14/2015   uvula removed   Uvula Removed      Home Medications:  Allergies as of 02/20/2024       Reactions   Atorvastatin Other (See Comments)   Joint pain   Penicillins Itching   Rosuvastatin Other (See Comments)   Joint pain        Medication List        Accurate as of February 20, 2024 11:59 PM. If you have any  questions, ask your nurse or doctor.          STOP taking these medications    Enalapril-hydroCHLOROthiazide 5-12.5 MG tablet Stopped by: Lucie Hones       TAKE these medications    acetaminophen  500 MG tablet Commonly known as: TYLENOL  Take 1,000 mg by mouth every 6 (six) hours as needed for moderate pain (pain score 4-6).   alfuzosin  10 MG 24 hr tablet Commonly known as: UROXATRAL  TAKE ONE TABLET BY MOUTH ONE TIME DAILY WITH BREAKFAST   aspirin  EC 81 MG tablet Take 81 mg by mouth daily. Swallow whole.   atomoxetine 40 MG capsule Commonly known as: STRATTERA Take 80 mg by mouth every morning.   Brilinta  90 MG Tabs tablet Generic drug: ticagrelor  Take 1 tablet (90 mg total) by mouth 2 (two) times daily.   cephALEXin  500 MG capsule Commonly known as: KEFLEX  Take 1 capsule (500 mg total) by mouth 4 (four) times daily.   Contour Next Test test strip Generic drug: glucose blood TEST BLOOD SUGAR TWICE A DAY   ezetimibe 10 MG tablet Commonly known as: ZETIA Take 10 mg by mouth daily.   HYDROcodone -acetaminophen  5-325 MG tablet Commonly known as: NORCO/VICODIN Take 1-2 tablets by mouth every 4 (four) hours as needed for moderate pain (pain score 4-6) or severe pain (  pain score 7-10). What changed:  how much to take when to take this reasons to take this Changed by: Lasharn Bufkin   L-METHYLFOLATE PO Take 1 tablet by mouth daily.   metFORMIN 750 MG 24 hr tablet Commonly known as: GLUCOPHAGE-XR Take 750 mg by mouth daily with breakfast.   metoprolol  succinate 25 MG 24 hr tablet Commonly known as: TOPROL -XL Take 12.5 mg by mouth daily.   ondansetron  4 MG disintegrating tablet Commonly known as: ZOFRAN -ODT Take 1 tablet (4 mg total) by mouth every 6 (six) hours as needed for nausea or vomiting.   pravastatin 40 MG tablet Commonly known as: PRAVACHOL Take 40 mg by mouth at bedtime.   tadalafil  5 MG tablet Commonly known as: CIALIS  Take  1 tablet (5 mg total) by mouth daily as needed for erectile dysfunction.   tadalafil  20 MG tablet Commonly known as: CIALIS  Take 1 tablet (20 mg total) by mouth daily as needed for erectile dysfunction.   tamsulosin  0.4 MG Caps capsule Commonly known as: FLOMAX  Take 1 capsule (0.4 mg total) by mouth daily.   Vilazodone HCl 40 MG Tabs Commonly known as: VIIBRYD Take 40 mg by mouth daily.        Allergies:  Allergies  Allergen Reactions   Atorvastatin Other (See Comments)    Joint pain   Penicillins Itching   Rosuvastatin Other (See Comments)    Joint pain    Family History: No family history on file.  Social History:   reports that he has quit smoking. His smoking use included cigarettes. He has never used smokeless tobacco. He reports current alcohol use. He reports that he does not use drugs.  Physical Exam: BP (!) 147/83   Pulse 96   Temp (!) 97.5 F (36.4 C) (Oral)   Ht 5' 3 (1.6 m)   Wt 149 lb 6.4 oz (67.8 kg)   BMI 26.47 kg/m   Constitutional:  Alert and oriented, uncomfortable appearing, nontoxic appearing HEENT: [place], AT Cardiovascular: No clubbing, cyanosis, or edema Respiratory: Normal respiratory effort, no increased work of breathing Skin: No rashes, bruises or suspicious lesions Neurologic: Grossly intact, no focal deficits, moving all 4 extremities Psychiatric: Normal mood and affect  Laboratory Data: Results for orders placed or performed in visit on 02/20/24  Microscopic Examination   Collection Time: 02/20/24  2:05 PM   Urine  Result Value Ref Range   WBC, UA 0-5 0 - 5 /hpf   RBC, Urine 3-10 (A) 0 - 2 /hpf   Epithelial Cells (non renal) 0-10 0 - 10 /hpf   Bacteria, UA Few None seen/Few  Urinalysis, Complete   Collection Time: 02/20/24  2:05 PM  Result Value Ref Range   Specific Gravity, UA 1.020 1.005 - 1.030   pH, UA 6.0 5.0 - 7.5   Color, UA Yellow Yellow   Appearance Ur Clear Clear   Leukocytes,UA Negative Negative   Protein,UA  Negative Negative/Trace   Glucose, UA Negative Negative   Ketones, UA Trace (A) Negative   RBC, UA Negative Negative   Bilirubin, UA Negative Negative   Urobilinogen, Ur 0.2 0.2 - 1.0 mg/dL   Nitrite, UA Negative Negative   Microscopic Examination Comment    Microscopic Examination See below:     Pertinent Imaging: Results for orders placed during the hospital encounter of 02/16/24  CT Renal Stone Study  Narrative CLINICAL DATA:  Abdominal pain. Left-sided flank pain. History of kidney stones.  EXAM: CT ABDOMEN AND PELVIS WITHOUT CONTRAST  TECHNIQUE:  Multidetector CT imaging of the abdomen and pelvis was performed following the standard protocol without IV contrast.  RADIATION DOSE REDUCTION: This exam was performed according to the departmental dose-optimization program which includes automated exposure control, adjustment of the mA and/or kV according to patient size and/or use of iterative reconstruction technique.  COMPARISON:  07/20/2011  FINDINGS: Lower chest: Tiny 2-3 mm pulmonary nodules are seen in the right lower and right middle lobes. The right middle lobe nodule is stable since 2012 consistent with benign etiology. The right lower lobe nodule is new in the interval since that study and also comparing to CT a chest 06/11/2023.  Hepatobiliary: No suspicious focal abnormality in the liver on this study without intravenous contrast. There is no evidence for gallstones, gallbladder wall thickening, or pericholecystic fluid. No intrahepatic or extrahepatic biliary dilation.  Pancreas: No focal mass lesion. No dilatation of the main duct. No intraparenchymal cyst. No peripancreatic edema.  Spleen: No splenomegaly. No suspicious focal mass lesion.  Adrenals/Urinary Tract: No adrenal nodule or mass. Right kidney and ureter unremarkable. Punctate nonobstructing stone identified upper pole left kidney. Mild to moderate left hydronephrosis associated with  prominent left perinephric edema. These changes are secondary to the presence of a 9 x 6 x 7 mm left UPJ stone (36/2). Left ureter otherwise unremarkable. The urinary bladder appears normal for the degree of distention.  Stomach/Bowel: Tiny hiatal hernia. Stomach otherwise unremarkable. Duodenum is normally positioned as is the ligament of Treitz. No small bowel wall thickening. No small bowel dilatation. The terminal ileum is normal. The appendix is normal. No gross colonic mass. No colonic wall thickening.  Vascular/Lymphatic: There is mild atherosclerotic calcification of the abdominal aorta without aneurysm. There is no gastrohepatic or hepatoduodenal ligament lymphadenopathy. No retroperitoneal or mesenteric lymphadenopathy. No pelvic sidewall lymphadenopathy.  Reproductive: The prostate gland and seminal vesicles are unremarkable.  Other: No intraperitoneal free fluid.  Musculoskeletal: No worrisome lytic or sclerotic osseous abnormality.  IMPRESSION: 1. 9 x 6 x 7 mm left UPJ stone with mild to moderate left hydronephrosis and prominent left perinephric edema. 2. Punctate nonobstructing stone upper pole left kidney. 3. Tiny hiatal hernia. 4. Tiny 2-3 mm right lower lobe nodule is new in the interval since CT chest 06/11/2023. No follow-up needed if patient is low-risk.This recommendation follows the consensus statement: Guidelines for Management of Incidental Pulmonary Nodules Detected on CT Images: From the Fleischner Society 2017; Radiology 2017; 284:228-243. 5.  Aortic Atherosclerosis (ICD10-I70.0).   Electronically Signed By: Camellia Candle M.D. On: 02/16/2024 07:07   I personally reviewed the images referenced above and note a 9mm left UPJ stone.  Assessment & Plan:   1. Obstruction of left ureteropelvic junction (UPJ) due to stone (Primary) Renal colic 2/2 a 9mm left UPJ stone, no signs of urinary infection.  We discussed various treatment options for his  stone including trial of passage vs. ESWL vs. ureteroscopy with laser lithotripsy and stent. I was honest that I do not think he will spontaneously pass a stone of this size.  We specifically discussed that ESWL is a less invasive procedure that requires less anesthesia, however patients have to pass their residual stone fragments, which may take several weeks and be associated with continued renal colic. Additionally, we discussed the limitations of ESWL including the low, 10-20% chance of treatment failure requiring repeat ESWL versus ureteroscopy in the future. By comparison, ureteroscopy is a more invasive surgical procedure that requires more anesthesia and requires placement of a ureteral stent, which  will remain in place for approximately 3-10 days and can be associated with flank pain, bladder pain, dysuria, urgency, frequency, urinary leakage, and gross hematuria.  Based on this conversation, he would like to proceed with ureteroscopy. Orders in for next week with Dr. Francisca.  Will repeat culture today for preop testing, though anticipate this will be negative. Refilling Flomax  and Norco and we gave him Toradol  in clinic today. We discussed return precautions including fever, uncontrolled pain, and uncontrolled nausea/vomiting. - Urinalysis, Complete - CULTURE, URINE COMPREHENSIVE - Ambulatory Referral For Surgery Scheduling - tamsulosin  (FLOMAX ) 0.4 MG CAPS capsule; Take 1 capsule (0.4 mg total) by mouth daily.  Dispense: 7 capsule; Refill: 0 - HYDROcodone -acetaminophen  (NORCO/VICODIN) 5-325 MG tablet; Take 1-2 tablets by mouth every 4 (four) hours as needed for moderate pain (pain score 4-6) or severe pain (pain score 7-10).  Dispense: 20 tablet; Refill: 0 - ketorolac  (TORADOL ) injection 15 mg   Return in 4 days (on 02/24/2024) for Left ureteroscopy with Dr. Francisca.  Lucie Hones, PA-C  [facility] [place] [address] [place], [place]  [address] [phone]

## 2024-02-21 NOTE — Addendum Note (Signed)
 Addended by: CLEOTILDE CAMELIA MATSU on: 02/21/2024 04:03 PM   Modules accepted: Orders

## 2024-02-21 NOTE — Progress Notes (Signed)
 02/20/2024 9:46 PM   Danny Brown 08/12/1955 [id number]  CC: Chief Complaint  Patient presents with   Nephrolithiasis   HPI: Danny Brown is a 68 y.o. male with PMH diabetes, ED on demand dose tadalafil  and BPH who presents today for ED follow up of a 9mm left UPJ stone. He is accompanied today by his wife.  He was seen in our ED on 7/27 with reports of left flank and LLQ pain. CT stone study showed a 9mm left UPJ stone and punctate left upper pole stone.  UA showed >50 RBCs/hpf and 21-50 WBCs/hpf; urine culture negative.  Today he reports ongoing severe left flank pain but no fevers. This is his first stone episode, though he has a family history of kidney stones.  In-office UA today positive for trace ketones; urine microscopy with 3-10 RBCs/HPF.  PMH: Past Medical History:  Diagnosis Date   Diabetes mellitus without complication (HCC)    Dyspnea    GERD (gastroesophageal reflux disease)    Gilbert syndrome    Heart murmur    Hypercholesterolemia    Hyperlipidemia    Hypertension    Hypochloremia    Sleep apnea     Surgical History: Past Surgical History:  Procedure Laterality Date   CORONARY STENT INTERVENTION N/A 06/19/2023   Procedure: CORONARY STENT INTERVENTION;  Surgeon: Florencio Cara BIRCH, MD;  Location: [facility];  Service: Cardiovascular;  Laterality: N/A;   LEFT HEART CATH AND CORONARY ANGIOGRAPHY Left 06/19/2023   Procedure: LEFT HEART CATH AND CORONARY ANGIOGRAPHY;  Surgeon: Florencio Cara BIRCH, MD;  Location: [facility];  Service: Cardiovascular;  Laterality: Left;   PALATE / UVULA BIOPSY / EXCISION  07/14/2015   uvula removed   Uvula Removed      Home Medications:  Allergies as of 02/20/2024       Reactions   Atorvastatin Other (See Comments)   Joint pain   Penicillins Itching   Rosuvastatin Other (See Comments)   Joint pain        Medication List        Accurate as of February 20, 2024 11:59 PM. If you have any  questions, ask your nurse or doctor.          STOP taking these medications    Enalapril-hydroCHLOROthiazide 5-12.5 MG tablet Stopped by: Lucie Hones       TAKE these medications    acetaminophen  500 MG tablet Commonly known as: TYLENOL  Take 1,000 mg by mouth every 6 (six) hours as needed for moderate pain (pain score 4-6).   alfuzosin  10 MG 24 hr tablet Commonly known as: UROXATRAL  TAKE ONE TABLET BY MOUTH ONE TIME DAILY WITH BREAKFAST   aspirin  EC 81 MG tablet Take 81 mg by mouth daily. Swallow whole.   atomoxetine 40 MG capsule Commonly known as: STRATTERA Take 80 mg by mouth every morning.   Brilinta  90 MG Tabs tablet Generic drug: ticagrelor  Take 1 tablet (90 mg total) by mouth 2 (two) times daily.   cephALEXin  500 MG capsule Commonly known as: KEFLEX  Take 1 capsule (500 mg total) by mouth 4 (four) times daily.   Contour Next Test test strip Generic drug: glucose blood TEST BLOOD SUGAR TWICE A DAY   ezetimibe 10 MG tablet Commonly known as: ZETIA Take 10 mg by mouth daily.   HYDROcodone -acetaminophen  5-325 MG tablet Commonly known as: NORCO/VICODIN Take 1-2 tablets by mouth every 4 (four) hours as needed for moderate pain (pain score 4-6) or severe pain (  pain score 7-10). What changed:  how much to take when to take this reasons to take this Changed by: Lasharn Bufkin   L-METHYLFOLATE PO Take 1 tablet by mouth daily.   metFORMIN 750 MG 24 hr tablet Commonly known as: GLUCOPHAGE-XR Take 750 mg by mouth daily with breakfast.   metoprolol  succinate 25 MG 24 hr tablet Commonly known as: TOPROL -XL Take 12.5 mg by mouth daily.   ondansetron  4 MG disintegrating tablet Commonly known as: ZOFRAN -ODT Take 1 tablet (4 mg total) by mouth every 6 (six) hours as needed for nausea or vomiting.   pravastatin 40 MG tablet Commonly known as: PRAVACHOL Take 40 mg by mouth at bedtime.   tadalafil  5 MG tablet Commonly known as: CIALIS  Take  1 tablet (5 mg total) by mouth daily as needed for erectile dysfunction.   tadalafil  20 MG tablet Commonly known as: CIALIS  Take 1 tablet (20 mg total) by mouth daily as needed for erectile dysfunction.   tamsulosin  0.4 MG Caps capsule Commonly known as: FLOMAX  Take 1 capsule (0.4 mg total) by mouth daily.   Vilazodone HCl 40 MG Tabs Commonly known as: VIIBRYD Take 40 mg by mouth daily.        Allergies:  Allergies  Allergen Reactions   Atorvastatin Other (See Comments)    Joint pain   Penicillins Itching   Rosuvastatin Other (See Comments)    Joint pain    Family History: No family history on file.  Social History:   reports that he has quit smoking. His smoking use included cigarettes. He has never used smokeless tobacco. He reports current alcohol use. He reports that he does not use drugs.  Physical Exam: BP (!) 147/83   Pulse 96   Temp (!) 97.5 F (36.4 C) (Oral)   Ht 5' 3 (1.6 m)   Wt 149 lb 6.4 oz (67.8 kg)   BMI 26.47 kg/m   Constitutional:  Alert and oriented, uncomfortable appearing, nontoxic appearing HEENT: [place], AT Cardiovascular: No clubbing, cyanosis, or edema Respiratory: Normal respiratory effort, no increased work of breathing Skin: No rashes, bruises or suspicious lesions Neurologic: Grossly intact, no focal deficits, moving all 4 extremities Psychiatric: Normal mood and affect  Laboratory Data: Results for orders placed or performed in visit on 02/20/24  Microscopic Examination   Collection Time: 02/20/24  2:05 PM   Urine  Result Value Ref Range   WBC, UA 0-5 0 - 5 /hpf   RBC, Urine 3-10 (A) 0 - 2 /hpf   Epithelial Cells (non renal) 0-10 0 - 10 /hpf   Bacteria, UA Few None seen/Few  Urinalysis, Complete   Collection Time: 02/20/24  2:05 PM  Result Value Ref Range   Specific Gravity, UA 1.020 1.005 - 1.030   pH, UA 6.0 5.0 - 7.5   Color, UA Yellow Yellow   Appearance Ur Clear Clear   Leukocytes,UA Negative Negative   Protein,UA  Negative Negative/Trace   Glucose, UA Negative Negative   Ketones, UA Trace (A) Negative   RBC, UA Negative Negative   Bilirubin, UA Negative Negative   Urobilinogen, Ur 0.2 0.2 - 1.0 mg/dL   Nitrite, UA Negative Negative   Microscopic Examination Comment    Microscopic Examination See below:     Pertinent Imaging: Results for orders placed during the hospital encounter of 02/16/24  CT Renal Stone Study  Narrative CLINICAL DATA:  Abdominal pain. Left-sided flank pain. History of kidney stones.  EXAM: CT ABDOMEN AND PELVIS WITHOUT CONTRAST  TECHNIQUE:  Multidetector CT imaging of the abdomen and pelvis was performed following the standard protocol without IV contrast.  RADIATION DOSE REDUCTION: This exam was performed according to the departmental dose-optimization program which includes automated exposure control, adjustment of the mA and/or kV according to patient size and/or use of iterative reconstruction technique.  COMPARISON:  07/20/2011  FINDINGS: Lower chest: Tiny 2-3 mm pulmonary nodules are seen in the right lower and right middle lobes. The right middle lobe nodule is stable since 2012 consistent with benign etiology. The right lower lobe nodule is new in the interval since that study and also comparing to CT a chest 06/11/2023.  Hepatobiliary: No suspicious focal abnormality in the liver on this study without intravenous contrast. There is no evidence for gallstones, gallbladder wall thickening, or pericholecystic fluid. No intrahepatic or extrahepatic biliary dilation.  Pancreas: No focal mass lesion. No dilatation of the main duct. No intraparenchymal cyst. No peripancreatic edema.  Spleen: No splenomegaly. No suspicious focal mass lesion.  Adrenals/Urinary Tract: No adrenal nodule or mass. Right kidney and ureter unremarkable. Punctate nonobstructing stone identified upper pole left kidney. Mild to moderate left hydronephrosis associated with  prominent left perinephric edema. These changes are secondary to the presence of a 9 x 6 x 7 mm left UPJ stone (36/2). Left ureter otherwise unremarkable. The urinary bladder appears normal for the degree of distention.  Stomach/Bowel: Tiny hiatal hernia. Stomach otherwise unremarkable. Duodenum is normally positioned as is the ligament of Treitz. No small bowel wall thickening. No small bowel dilatation. The terminal ileum is normal. The appendix is normal. No gross colonic mass. No colonic wall thickening.  Vascular/Lymphatic: There is mild atherosclerotic calcification of the abdominal aorta without aneurysm. There is no gastrohepatic or hepatoduodenal ligament lymphadenopathy. No retroperitoneal or mesenteric lymphadenopathy. No pelvic sidewall lymphadenopathy.  Reproductive: The prostate gland and seminal vesicles are unremarkable.  Other: No intraperitoneal free fluid.  Musculoskeletal: No worrisome lytic or sclerotic osseous abnormality.  IMPRESSION: 1. 9 x 6 x 7 mm left UPJ stone with mild to moderate left hydronephrosis and prominent left perinephric edema. 2. Punctate nonobstructing stone upper pole left kidney. 3. Tiny hiatal hernia. 4. Tiny 2-3 mm right lower lobe nodule is new in the interval since CT chest 06/11/2023. No follow-up needed if patient is low-risk.This recommendation follows the consensus statement: Guidelines for Management of Incidental Pulmonary Nodules Detected on CT Images: From the Fleischner Society 2017; Radiology 2017; 284:228-243. 5.  Aortic Atherosclerosis (ICD10-I70.0).   Electronically Signed By: Camellia Candle M.D. On: 02/16/2024 07:07   I personally reviewed the images referenced above and note a 9mm left UPJ stone.  Assessment & Plan:   1. Obstruction of left ureteropelvic junction (UPJ) due to stone (Primary) Renal colic 2/2 a 9mm left UPJ stone, no signs of urinary infection.  We discussed various treatment options for his  stone including trial of passage vs. ESWL vs. ureteroscopy with laser lithotripsy and stent. I was honest that I do not think he will spontaneously pass a stone of this size.  We specifically discussed that ESWL is a less invasive procedure that requires less anesthesia, however patients have to pass their residual stone fragments, which may take several weeks and be associated with continued renal colic. Additionally, we discussed the limitations of ESWL including the low, 10-20% chance of treatment failure requiring repeat ESWL versus ureteroscopy in the future. By comparison, ureteroscopy is a more invasive surgical procedure that requires more anesthesia and requires placement of a ureteral stent, which  will remain in place for approximately 3-10 days and can be associated with flank pain, bladder pain, dysuria, urgency, frequency, urinary leakage, and gross hematuria.  Based on this conversation, he would like to proceed with ureteroscopy. Orders in for next week with Dr. Francisca.  Will repeat culture today for preop testing, though anticipate this will be negative. Refilling Flomax  and Norco and we gave him Toradol  in clinic today. We discussed return precautions including fever, uncontrolled pain, and uncontrolled nausea/vomiting. - Urinalysis, Complete - CULTURE, URINE COMPREHENSIVE - Ambulatory Referral For Surgery Scheduling - tamsulosin  (FLOMAX ) 0.4 MG CAPS capsule; Take 1 capsule (0.4 mg total) by mouth daily.  Dispense: 7 capsule; Refill: 0 - HYDROcodone -acetaminophen  (NORCO/VICODIN) 5-325 MG tablet; Take 1-2 tablets by mouth every 4 (four) hours as needed for moderate pain (pain score 4-6) or severe pain (pain score 7-10).  Dispense: 20 tablet; Refill: 0 - ketorolac  (TORADOL ) injection 15 mg   Return in 4 days (on 02/24/2024) for Left ureteroscopy with Dr. Francisca.  Lucie Hones, PA-C  [facility] [place] [address] [place], [place]  [address] [phone]

## 2024-02-23 MED ORDER — CHLORHEXIDINE GLUCONATE 0.12 % MT SOLN
15.0000 mL | Freq: Once | OROMUCOSAL | Status: AC
  Administered 2024-02-24: 15 mL via OROMUCOSAL

## 2024-02-23 MED ORDER — SODIUM CHLORIDE 0.9 % IV SOLN: INTRAVENOUS | Status: DC

## 2024-02-23 MED ORDER — CEFAZOLIN SODIUM-DEXTROSE 2-4 GM/100ML-% IV SOLN
2.0000 g | INTRAVENOUS | Status: AC
  Administered 2024-02-24: 2 g via INTRAVENOUS

## 2024-02-23 MED ORDER — ORAL CARE MOUTH RINSE: 15.0000 mL | Freq: Once | OROMUCOSAL | Status: AC

## 2024-02-24 ENCOUNTER — Ambulatory Visit

## 2024-02-24 ENCOUNTER — Encounter
Admission: RE | Disposition: A | Discharge status: Home / Self Care | Payer: Self-pay | Source: Home / Self Care | Attending: Urology

## 2024-02-24 ENCOUNTER — Other Ambulatory Visit: Payer: Self-pay | Admitting: Urology

## 2024-02-24 ENCOUNTER — Other Ambulatory Visit: Payer: Self-pay

## 2024-02-24 ENCOUNTER — Ambulatory Visit
Admission: RE | Admit: 2024-02-24 | Discharge: 2024-02-24 | Disposition: A | Discharge status: Home / Self Care | Attending: Urology | Admitting: Urology

## 2024-02-24 ENCOUNTER — Encounter: Payer: Self-pay | Admitting: Urology

## 2024-02-24 DIAGNOSIS — Z79899 Other long term (current) drug therapy: Secondary | ICD-10-CM | POA: Diagnosis not present

## 2024-02-24 DIAGNOSIS — E119 Type 2 diabetes mellitus without complications: Secondary | ICD-10-CM | POA: Diagnosis not present

## 2024-02-24 DIAGNOSIS — N400 Enlarged prostate without lower urinary tract symptoms: Secondary | ICD-10-CM | POA: Insufficient documentation

## 2024-02-24 DIAGNOSIS — Z87891 Personal history of nicotine dependence: Secondary | ICD-10-CM | POA: Diagnosis not present

## 2024-02-24 DIAGNOSIS — I10 Essential (primary) hypertension: Secondary | ICD-10-CM | POA: Diagnosis not present

## 2024-02-24 DIAGNOSIS — Z7984 Long term (current) use of oral hypoglycemic drugs: Secondary | ICD-10-CM | POA: Diagnosis not present

## 2024-02-24 DIAGNOSIS — N201 Calculus of ureter: Secondary | ICD-10-CM

## 2024-02-24 DIAGNOSIS — G4730 Sleep apnea, unspecified: Secondary | ICD-10-CM | POA: Diagnosis not present

## 2024-02-24 DIAGNOSIS — K219 Gastro-esophageal reflux disease without esophagitis: Secondary | ICD-10-CM | POA: Insufficient documentation

## 2024-02-24 DIAGNOSIS — Z955 Presence of coronary angioplasty implant and graft: Secondary | ICD-10-CM

## 2024-02-24 DIAGNOSIS — N132 Hydronephrosis with renal and ureteral calculous obstruction: Secondary | ICD-10-CM | POA: Diagnosis not present

## 2024-02-24 HISTORY — PX: CYSTOSCOPY WITH RETROGRADE PYELOGRAM, URETEROSCOPY AND STENT PLACEMENT: SHX5789

## 2024-02-24 LAB — GLUCOSE, CAPILLARY
Glucose-Capillary: 97 mg/dL (ref 70–99)
Glucose-Capillary: 105 mg/dL — ABNORMAL HIGH (ref 70–99)

## 2024-02-24 SURGERY — CYSTOURETEROSCOPY, WITH RETROGRADE PYELOGRAM AND STENT INSERTION
Anesthesia: General | Site: Ureter | Laterality: Left

## 2024-02-24 MED ORDER — FENTANYL CITRATE (PF) 100 MCG/2ML IJ SOLN: 25.0000 ug | INTRAMUSCULAR | Status: DC | PRN

## 2024-02-24 MED ORDER — ONDANSETRON HCL 4 MG/2ML IJ SOLN
INTRAMUSCULAR | Status: DC | PRN
  Administered 2024-02-24: 4 mg via INTRAVENOUS

## 2024-02-24 MED ORDER — ONDANSETRON HCL 4 MG/2ML IJ SOLN
INTRAMUSCULAR | Status: AC
  Filled 2024-02-24: qty 2

## 2024-02-24 MED ORDER — SUGAMMADEX SODIUM 200 MG/2ML IV SOLN
INTRAVENOUS | Status: DC | PRN
Start: 2024-02-24 — End: 2024-02-24
  Administered 2024-02-24: 200 mg via INTRAVENOUS

## 2024-02-24 MED ORDER — LACTATED RINGERS IV SOLN: INTRAVENOUS | Status: DC | PRN

## 2024-02-24 MED ORDER — FENTANYL CITRATE (PF) 100 MCG/2ML IJ SOLN
INTRAMUSCULAR | Status: DC | PRN
  Administered 2024-02-24: 50 ug via INTRAVENOUS

## 2024-02-24 MED ORDER — PROPOFOL 10 MG/ML IV BOLUS
INTRAVENOUS | Status: DC | PRN
  Administered 2024-02-24: 140 mg via INTRAVENOUS

## 2024-02-24 MED ORDER — CHLORHEXIDINE GLUCONATE 0.12 % MT SOLN
OROMUCOSAL | Status: AC
  Filled 2024-02-24: qty 15

## 2024-02-24 MED ORDER — ROCURONIUM BROMIDE 100 MG/10ML IV SOLN
INTRAVENOUS | Status: DC | PRN
  Administered 2024-02-24: 50 mg via INTRAVENOUS

## 2024-02-24 MED ORDER — DEXAMETHASONE SODIUM PHOSPHATE 10 MG/ML IJ SOLN
INTRAMUSCULAR | Status: DC | PRN
Start: 2024-02-24 — End: 2024-02-24
  Administered 2024-02-24: 10 mg via INTRAVENOUS

## 2024-02-24 MED ORDER — PROPOFOL 10 MG/ML IV BOLUS
INTRAVENOUS | Status: AC
  Filled 2024-02-24: qty 20

## 2024-02-24 MED ORDER — SODIUM CHLORIDE 0.9 % IR SOLN
Status: DC | PRN
  Administered 2024-02-24: 3000 mL

## 2024-02-24 MED ORDER — ROCURONIUM BROMIDE 10 MG/ML (PF) SYRINGE
PREFILLED_SYRINGE | INTRAVENOUS | Status: AC
  Filled 2024-02-24: qty 10

## 2024-02-24 MED ORDER — KETOROLAC TROMETHAMINE 15 MG/ML IJ SOLN
INTRAMUSCULAR | Status: DC | PRN
  Administered 2024-02-24: 15 mg via INTRAVENOUS

## 2024-02-24 MED ORDER — DEXAMETHASONE SODIUM PHOSPHATE 10 MG/ML IJ SOLN
INTRAMUSCULAR | Status: AC
  Filled 2024-02-24: qty 1

## 2024-02-24 MED ORDER — DROPERIDOL 2.5 MG/ML IJ SOLN: 0.6250 mg | Freq: Once | INTRAMUSCULAR | Status: DC | PRN

## 2024-02-24 MED ORDER — FENTANYL CITRATE (PF) 100 MCG/2ML IJ SOLN
INTRAMUSCULAR | Status: AC
Start: 2024-02-24 — End: 2024-02-24
  Filled 2024-02-24: qty 2

## 2024-02-24 MED ORDER — IOHEXOL 180 MG/ML  SOLN
INTRAMUSCULAR | Status: DC | PRN
  Administered 2024-02-24: 10 mL

## 2024-02-24 MED ORDER — LIDOCAINE HCL (CARDIAC) PF 100 MG/5ML IV SOSY
PREFILLED_SYRINGE | INTRAVENOUS | Status: DC | PRN
  Administered 2024-02-24: 100 mg via INTRAVENOUS

## 2024-02-24 MED ORDER — CEFAZOLIN SODIUM-DEXTROSE 2-4 GM/100ML-% IV SOLN
INTRAVENOUS | Status: AC
  Filled 2024-02-24: qty 100

## 2024-02-24 SURGICAL SUPPLY — 27 items
ADHESIVE MASTISOL STRL (MISCELLANEOUS) IMPLANT
BAG DRAIN SIEMENS DORNER NS (MISCELLANEOUS) ×2 IMPLANT
BAG PRESSURE INF REUSE 3000 (BAG) ×2 IMPLANT
BRUSH SCRUB EZ 4% CHG (MISCELLANEOUS) ×2 IMPLANT
CATH URET FLEX-TIP 2 LUMEN 10F (CATHETERS) ×1 IMPLANT
CATH URETL OPEN 5X70 (CATHETERS) ×1 IMPLANT
CNTNR URN SCR LID CUP LEK RST (MISCELLANEOUS) IMPLANT
DRAPE UTILITY 15X26 TOWEL STRL (DRAPES) ×2 IMPLANT
DRSG TEGADERM 2-3/8X2-3/4 SM (GAUZE/BANDAGES/DRESSINGS) IMPLANT
FIBER LASER MOSES 200 DFL (Laser) IMPLANT
FIBER LASER MOSES 365 DFL (Laser) IMPLANT
GLOVE BIOGEL PI IND STRL 7.5 (GLOVE) ×2 IMPLANT
GOWN STRL REUS W/ TWL LRG LVL3 (GOWN DISPOSABLE) ×2 IMPLANT
GOWN STRL REUS W/ TWL XL LVL3 (GOWN DISPOSABLE) ×2 IMPLANT
GUIDEWIRE GREEN .038 145CM (MISCELLANEOUS) ×1 IMPLANT
GUIDEWIRE STR DUAL SENSOR (WIRE) ×3 IMPLANT
KIT TURNOVER CYSTO (KITS) ×2 IMPLANT
PACK CYSTO AR (MISCELLANEOUS) ×2 IMPLANT
SET CYSTO W/LG BORE CLAMP LF (SET/KITS/TRAYS/PACK) ×2 IMPLANT
SHEATH NAVIGATOR HD 12/14X36 (SHEATH) IMPLANT
SOL .9 NS 3000ML IRR UROMATIC (IV SOLUTION) ×2 IMPLANT
STENT URET 6FRX24 CONTOUR (STENTS) ×1 IMPLANT
STENT URET 6FRX26 CONTOUR (STENTS) IMPLANT
SURGILUBE 2OZ TUBE FLIPTOP (MISCELLANEOUS) ×2 IMPLANT
SYR 10ML LL (SYRINGE) ×2 IMPLANT
VALVE UROSEAL ADJ ENDO (VALVE) ×1 IMPLANT
WATER STERILE IRR 500ML POUR (IV SOLUTION) ×2 IMPLANT

## 2024-02-24 NOTE — Interval H&P Note (Signed)
 UROLOGY H&P UPDATE  Agree with prior H&P dated 02/20/2024, 9 mm left UPJ stone  Cardiac: RRR Lungs: CTA bilaterally  Laterality: Left Procedure: Left ureteroscopy, laser lithotripsy, stent placement  Urine: Culture no growth  We specifically discussed the risks ureteroscopy including bleeding, infection/sepsis, stent related symptoms including flank pain/urgency/frequency/incontinence/dysuria, ureteral injury, ureteral stricture, inability to access stone, or need for staged or additional procedures.   Danny JAYSON Burnet, MD 02/24/2024

## 2024-02-24 NOTE — Anesthesia Postprocedure Evaluation (Signed)
 Anesthesia Post Note  Patient: Danny Brown  Procedure(s) Performed: ERNESTA, WITH RETROGRADE PYELOGRAM AND STENT INSERTION (Left: Ureter)  Patient location during evaluation: PACU Anesthesia Type: General Level of consciousness: awake Pain management: satisfactory to patient Vital Signs Assessment: post-procedure vital signs reviewed and stable Respiratory status: spontaneous breathing Cardiovascular status: stable Anesthetic complications: no   No notable events documented.   Last Vitals:  Vitals:   02/24/24 1445 02/24/24 1500  BP: 130/83 (!) 143/85  Pulse: 73 71  Resp: 13 18  Temp:    SpO2: 98% 97%    Last Pain:  Vitals:   02/24/24 1500  TempSrc:   PainSc: 0-No pain                 VAN STAVEREN,Eyan Hagood

## 2024-02-24 NOTE — Anesthesia Preprocedure Evaluation (Signed)
 Anesthesia Evaluation  Patient identified by MRN, date of birth, ID band Patient awake    Reviewed: Allergy & Precautions, H&P , NPO status , Patient's Chart, lab work & pertinent test results, reviewed documented beta blocker date and time   History of Anesthesia Complications Negative for: history of anesthetic complications  Airway Mallampati: I  TM Distance: >3 FB Neck ROM: full    Dental  (+) Dental Advidsory Given, Missing, Teeth Intact   Pulmonary neg shortness of breath, sleep apnea and Continuous Positive Airway Pressure Ventilation , neg COPD, neg recent URI, former smoker   Pulmonary exam normal breath sounds clear to auscultation       Cardiovascular Exercise Tolerance: Good hypertension, (-) angina + CAD and + Cardiac Stents  (-) Past MI Normal cardiovascular exam(-) dysrhythmias + Valvular Problems/Murmurs  Rhythm:regular Rate:Normal  ECHO 01/27/24: NORMAL LEFT VENTRICULAR SYSTOLIC FUNCTION WITH NO LVH  ESTIMATED EF: >55%  NORMAL LA PRESSURES WITH DIASTOLIC DYSFUNCTION (GRADE 1)  NORMAL RIGHT VENTRICULAR SYSTOLIC FUNCTION  VALVULAR REGURGITATION: MILD AR, MILD MR, MILD PR, MILD TR  ESTIMATED RVSP: 27 mmHg  NO VALVULAR STENOSIS     Neuro/Psych negative neurological ROS  negative psych ROS   GI/Hepatic Neg liver ROS,GERD  Controlled,,  Endo/Other  diabetes, Type 2, Oral Hypoglycemic Agents    Renal/GU negative Renal ROS  negative genitourinary   Musculoskeletal   Abdominal   Peds  Hematology negative hematology ROS (+)   Anesthesia Other Findings Past Medical History: No date: Diabetes mellitus without complication (HCC) No date: Dyspnea No date: GERD (gastroesophageal reflux disease) 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 No date: Heart murmur No date: Hypercholesterolemia No date: Hyperlipidemia No date: Hypertension No date: Hypochloremia No date: Sleep apnea   Reproductive/Obstetrics negative OB  ROS                              Anesthesia Physical Anesthesia Plan  ASA: 3  Anesthesia Plan: General   Post-op Pain Management:    Induction: Intravenous  PONV Risk Score and Plan: 2 and Ondansetron , Dexamethasone  and Treatment may vary due to age or medical condition  Airway Management Planned: Oral ETT  Additional Equipment:   Intra-op Plan:   Post-operative Plan: Extubation in OR  Informed Consent: I have reviewed the patients History and Physical, chart, labs and discussed the procedure including the risks, benefits and alternatives for the proposed anesthesia with the patient or authorized representative who has indicated his/her understanding and acceptance.     Dental Advisory Given  Plan Discussed with: Anesthesiologist, CRNA and Surgeon  Anesthesia Plan Comments:          Anesthesia Quick Evaluation

## 2024-02-24 NOTE — Transfer of Care (Signed)
 Immediate Anesthesia Transfer of Care Note  Patient: Danny Brown  Procedure(s) Performed: CYSTOSCOPY/URETEROSCOPY/HOLMIUM LASER/STENT PLACEMENT (Left: Ureter)  Patient Location: PACU  Anesthesia Type:General  Level of Consciousness: drowsy and patient cooperative  Airway & Oxygen Therapy: Patient Spontanous Breathing and Patient connected to face mask oxygen  Post-op Assessment: Report given to RN and Post -op Vital signs reviewed and stable  Post vital signs: Reviewed and stable  Last Vitals:  Vitals Value Taken Time  BP 142/84 02/24/24 14:31  Temp 36.7 C 02/24/24 14:31  Pulse 76 02/24/24 14:34  Resp 16 02/24/24 14:34  SpO2 98 % 02/24/24 14:34  Vitals shown include unfiled device data.  Last Pain:  Vitals:   02/24/24 1431  TempSrc:   PainSc: Asleep      Patients Stated Pain Goal: 0 (02/24/24 1016)  Complications: No notable events documented.

## 2024-02-24 NOTE — Anesthesia Procedure Notes (Signed)
 Procedure Name: Intubation Date/Time: 02/24/2024 1:58 PM  Performed by: Blair Suzen BRAVO, RNPre-anesthesia Checklist: Patient identified, Emergency Drugs available, Suction available and Patient being monitored Patient Re-evaluated:Patient Re-evaluated prior to induction Oxygen Delivery Method: Circle system utilized Preoxygenation: Pre-oxygenation with 100% oxygen Induction Type: IV induction Ventilation: Two handed mask ventilation required and Oral airway inserted - appropriate to patient size Laryngoscope Size: Mac and 4 Grade View: Grade I Tube type: Oral Tube size: 7.5 mm Number of attempts: 1 Airway Equipment and Method: Stylet and Oral airway Placement Confirmation: ETT inserted through vocal cords under direct vision, positive ETCO2 and breath sounds checked- equal and bilateral Secured at: 20 cm Tube secured with: Tape Dental Injury: Teeth and Oropharynx as per pre-operative assessment

## 2024-02-24 NOTE — Op Note (Signed)
 Date of procedure: 02/24/24  Preoperative diagnosis:  Left proximal ureteral stone  Postoperative diagnosis:  Same  Procedure: Cystoscopy, left retrograde pyelogram with intraoperative interpretation, left ureteral stent placement  Surgeon: Redell Burnet, MD  Anesthesia: General  Complications: None  Intraoperative findings:  Small prostate, normal bladder Unable to advance ureteroscope past the mid ureter to access stone, stent placed  EBL: Minimal  Specimens: None  Drains: Left 6 French by 24 cm ureteral stent  Indication: Danny Brown is a 68 y.o. patient with 9 mm left proximal ureteral stone who opted for ureteroscopy.  After reviewing the management options for treatment, they elected to proceed with the above surgical procedure(s). We have discussed the potential benefits and risks of the procedure, side effects of the proposed treatment, the likelihood of the patient achieving the goals of the procedure, and any potential problems that might occur during the procedure or recuperation. Informed consent has been obtained.  Description of procedure:  The patient was taken to the operating room and general anesthesia was induced. SCDs were placed for DVT prophylaxis. The patient was placed in the dorsal lithotomy position, prepped and draped in the usual sterile fashion, and preoperative antibiotics were administered. A preoperative time-out was performed.   A 21 French rigid cystoscope was used to intubate the urethra and a normal-appearing urethra was followed proximally towards the bladder.  Prostate was small, bladder was grossly normal, ureteral orifices orthotopic bilaterally and close to the bladder neck.  A sensor wire was used to intubate the left ureteral orifice and advanced up to the kidney under fluoroscopic vision.  A 8/10 French dual-lumen ureteral access catheter was advanced over the wire but met resistance in the mid ureter.  A second sensor safety wire was  added.  I attempted to advance the digital single-channel flexible ureteroscope but met resistance again in the mid ureter.  The safety wire was removed.  I then advanced the semirigid long ureteroscope alongside the wire and the mid ureter was noted to be extremely narrow.  I used a ureteral access catheter to replace the sensor wire with a Super Stiff wire, and again tried the digital single-channel flexible ureteroscope.  Again, met resistance in the mid ureter and unable to navigate the scope up to the stone.  The ureteral access catheter was advanced over the Super Stiff wire up to the kidney and the wire removed, retrograde pyelogram showed moderate hydronephrosis.  A sensor wire was replaced into the kidney and the axis catheter removed.  The rigid cystoscope was backloaded over the wire and a 6 [place] by 24 cm ureteral stent was placed uneventfully with a curl in the upper pole as well as in the bladder under direct vision.  Fluid drained through the sideports of the stent, the bladder was drained this concluded procedure.  Disposition: Stable to PACU  Plan: Will need follow-up ureteroscopy in 2 weeks for stone removal after passive dilation with ureteral stent, will send orders  Redell Burnet, MD

## 2024-02-25 ENCOUNTER — Encounter: Payer: Self-pay | Admitting: Urology

## 2024-02-25 ENCOUNTER — Other Ambulatory Visit: Payer: Self-pay

## 2024-02-25 ENCOUNTER — Telehealth: Payer: Self-pay

## 2024-02-25 DIAGNOSIS — N201 Calculus of ureter: Secondary | ICD-10-CM

## 2024-02-25 LAB — CULTURE, URINE COMPREHENSIVE

## 2024-02-25 NOTE — Progress Notes (Signed)
   [facility] Surgical Posting Form  Surgery Date: Date: 03/09/2024  Surgeon: Dr. Redell Burnet, MD  Inpt ( No  )   Outpt (Yes)   Obs ( No  )   Diagnosis: N20.1 Left Ureteral Stone  -CPT: [phone]  Surgery: Left Ureteroscopy with Laser Lithotripsy and Stent Exchange   Stop Anticoagulations: No  Cardiac/Medical/Pulmonary Clearance needed: No  *Orders entered into EPIC  Date: 02/25/24   *Case booked in [place]  Date: 02/25/24  *Notified pt of Surgery: Date: 02/25/24  PRE-OP UA & CX: no  *Placed into Prior Authorization Work [place] Date: 02/25/24  Assistant/laser/rep:No

## 2024-02-25 NOTE — Progress Notes (Signed)
 Surgical Physician Order Form [facility] [place]  * Scheduling expectation : 2 weeks  *Length of Case: 1 hour  *Clearance needed: no  *Anticoagulation Instructions: May continue all anticoagulants  *Aspirin  Instructions: Ok to continue Aspirin   *Post-op visit Date/Instructions:  tbd  *Diagnosis: Left UPJ Stone  *Procedure: left Ureteroscopy w/laser lithotripsy & stent Exchange  (47643)   Additional orders: N/A  -Admit type: OUTpatient  -Anesthesia: General  -VTE Prophylaxis Standing Order SCD's       Other:   -Standing Lab Orders Per Anesthesia    Lab other: none  -Standing Test orders EKG/Chest x-ray per Anesthesia       Test other:   - Medications:  cipro 400mg  IV  -Other orders:  N/A

## 2024-02-25 NOTE — Telephone Encounter (Signed)
 Per Dr. Francisca,  Patient is to be scheduled for Left Ureteroscopy with Laser Lithotripsy and Stent Exchange     Mr. Danny Brown was contacted and possible surgical dates were discussed, Monday August 18th, 2025 was agreed upon for surgery.   Patient was directed to call [phone] between 1-3pm the day before surgery to find out surgical arrival time.  Instructions were given not to eat or drink from midnight on the night before surgery and have a driver for the day of surgery. On the surgery day patient was instructed to enter through the [facility] entrance of [facility] report the Same Day Surgery desk.   Pre-Admit Testing will be in contact via phone to set up an interview with the anesthesia team to review your history and medications prior to surgery.   Reminder of this information was sent via MyChart to the patient.

## 2024-02-26 ENCOUNTER — Encounter: Payer: Self-pay | Admitting: Student in an Organized Health Care Education/Training Program

## 2024-02-26 ENCOUNTER — Ambulatory Visit (INDEPENDENT_AMBULATORY_CARE_PROVIDER_SITE_OTHER): Admitting: Student in an Organized Health Care Education/Training Program

## 2024-02-26 VITALS — BP 118/72 | HR 67 | Temp 97.1°F | Ht 63.0 in | Wt 145.4 lb

## 2024-02-26 DIAGNOSIS — R911 Solitary pulmonary nodule: Secondary | ICD-10-CM

## 2024-02-26 NOTE — Progress Notes (Signed)
 Synopsis: Referred in for pulmonary nodule by Danny Anes, MD  Assessment & Plan:   1. Lung nodule (Primary)  He presents for the evaluation of the right lower lobe pulmonary nodule that measures 3 mm in diameter and is new compared to prior.  He is also noted to have a right middle lobe nodule which appears unchanged compared to prior.  I have personally reviewed the patient's current CT urogram and compared it to his previous coronary CT as well as previous chest CT.  The right middle lobe nodule is calcified and is unchanged compared to prior, with a CT scan 12 years ago showing it to be unchanged.  The right lower lobe nodule is new compared to prior and appears benign in nature.  Discussed with Danny Brown that these findings likely represent a benign nodule.  He is intermediate risk given his history of smoking as well as occupational exposures, and I will obtain a repeat CT scan at the 1 year mark to further monitor this nodule.  Should there be no changes, we will stop further imaging.  - CT CHEST WO CONTRAST; Future  Return in about 1 year (around 02/25/2025).  I spent 60 minutes caring for this patient today, including preparing to see the patient, obtaining a medical history , reviewing a separately obtained history, reviewing the patient's imaging extensively and in depth, performing a medically appropriate examination and/or evaluation, counseling and educating the patient/family/caregiver, ordering medications, tests, or procedures, documenting clinical information in the electronic health record, and independently interpreting results (not separately reported/billed) and communicating results to the patient/family/caregiver  Danny November, MD [facility]  End of visit medications:  No orders of the defined types were placed in this encounter.    Current Outpatient Medications:    acetaminophen  (TYLENOL ) 500 MG tablet, Take 1,000 mg by mouth every 6 (six)  hours as needed for moderate pain (pain score 4-6)., Disp: , Rfl:    alfuzosin  (UROXATRAL ) 10 MG 24 hr tablet, TAKE ONE TABLET BY MOUTH ONE TIME DAILY WITH BREAKFAST, Disp: 30 tablet, Rfl: 3   aspirin  EC 81 MG tablet, Take 81 mg by mouth daily. Swallow whole., Disp: , Rfl:    atomoxetine (STRATTERA) 40 MG capsule, Take 80 mg by mouth every morning., Disp: , Rfl:    CONTOUR NEXT TEST test strip, TEST BLOOD SUGAR TWICE A DAY, Disp: , Rfl:    ezetimibe (ZETIA) 10 MG tablet, Take 10 mg by mouth daily., Disp: , Rfl:    HYDROcodone -acetaminophen  (NORCO/VICODIN) 5-325 MG tablet, Take 1-2 tablets by mouth every 4 (four) hours as needed for moderate pain (pain score 4-6) or severe pain (pain score 7-10)., Disp: 20 tablet, Rfl: 0   metFORMIN (GLUCOPHAGE-XR) 750 MG 24 hr tablet, Take 750 mg by mouth daily with breakfast., Disp: , Rfl:    metoprolol  succinate (TOPROL -XL) 25 MG 24 hr tablet, Take 12.5 mg by mouth daily., Disp: , Rfl:    nitroGLYCERIN  (NITROSTAT ) 0.4 MG SL tablet, Place 0.4 mg under the tongue every 5 (five) minutes as needed for chest pain., Disp: , Rfl:    ondansetron  (ZOFRAN -ODT) 4 MG disintegrating tablet, Take 1 tablet (4 mg total) by mouth every 6 (six) hours as needed for nausea or vomiting., Disp: 20 tablet, Rfl: 0   pravastatin (PRAVACHOL) 40 MG tablet, Take 40 mg by mouth at bedtime., Disp: , Rfl:    tadalafil  (CIALIS ) 20 MG tablet, Take 1 tablet (20 mg total) by mouth daily as needed  for erectile dysfunction., Disp: 90 tablet, Rfl: 3   tadalafil  (CIALIS ) 5 MG tablet, Take 1 tablet (5 mg total) by mouth daily as needed for erectile dysfunction. (Patient taking differently: Take 5 mg by mouth daily.), Disp: 90 tablet, Rfl: 3   Vilazodone HCl (VIIBRYD) 40 MG TABS, Take 40 mg by mouth daily., Disp: , Rfl:    Subjective:   PATIENT ID: Danny Brown GENDER: male DOB: 1956/06/11, MRN: [id number]  Chief Complaint  Patient presents with   Consult    Nodule. No SOB or wheezing. Does clear  his throat a lot.    HPI  Patient is a pleasant 68 year old male presenting to clinic for the evaluation of a pulmonary nodule.  Patient was in his usual state of health when developed left-sided flank pain concerning for kidney stone.  He underwent a CT urogram which showed incidental finding of a right lower lobe pulmonary nodule.  There was also a right middle lobe nodule which was stable when compared to prior.  Patient is referred to us  for further evaluation.  He recently underwent cystoscopy with left ureteral stent placement with plans for stone removal in 2 weeks.  He has no respiratory symptoms and is at baseline.  He has no shortness of breath, no cough, no wheeze, no chest pain, and chest tightness.  He has not had any recent chest infections.  Patient is originally from [place] , moved to   in 2006.  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  He previously smoked from the ages of [age], with around 20 pack years of smoking history.  Ancillary information including prior medications, full medical/surgical/family/social histories, and PFTs (when available) are listed below and have been reviewed.   Review of Systems  Constitutional:  Negative for chills, fever, malaise/fatigue and weight loss.  Respiratory:  Negative for cough, hemoptysis, sputum production, shortness of breath and wheezing.   Cardiovascular:  Negative for chest pain.     Objective:   Vitals:   02/26/24 1319  BP: 118/72  Pulse: 67  Temp: (!) 97.1 F (36.2 C)  SpO2: 97%  Weight: 145 lb 6.4 oz (66 kg)  Height: 5' 3 (1.6 m)   97% on RA BMI Readings from Last 3 Encounters:  02/26/24 25.76 kg/m  02/24/24 26.44 kg/m  02/20/24 26.47 kg/m   Wt Readings from Last 3 Encounters:  02/26/24 145 lb 6.4 oz (66 kg)  02/24/24 149 lb 4 oz (67.7 kg)  02/20/24 149 lb 6.4 oz (67.8 kg)    Physical Exam Constitutional:       Appearance: Normal appearance.  Cardiovascular:     Rate and Rhythm: Normal rate and regular rhythm.     Pulses: Normal pulses.     Heart sounds: Normal heart sounds.  Pulmonary:     Effort: Pulmonary effort is normal.     Breath sounds: Normal breath sounds.  Neurological:     General: No focal deficit present.     Mental Status: He is alert and oriented to person, place, and time. Mental status is at baseline.       Ancillary Information    Past Medical History:  Diagnosis Date   Diabetes mellitus without complication (HCC)    Dyspnea    GERD (gastroesophageal reflux disease)    Gilbert syndrome    Heart murmur    Hypercholesterolemia    Hyperlipidemia    Hypertension    Hypochloremia  Sleep apnea      History reviewed. No pertinent family history.   Past Surgical History:  Procedure Laterality Date   CORONARY STENT INTERVENTION N/A 06/19/2023   Procedure: CORONARY STENT INTERVENTION;  Surgeon: Florencio Cara BIRCH, MD;  Location: [facility] INVASIVE [facility];  Service: Cardiovascular;  Laterality: N/A;   CYSTOSCOPY WITH RETROGRADE PYELOGRAM, URETEROSCOPY AND STENT PLACEMENT Left 02/24/2024   Procedure: CYSTOURETEROSCOPY, WITH RETROGRADE PYELOGRAM AND STENT INSERTION;  Surgeon: Francisca Redell BROCKS, MD;  Location: [facility] ORS;  Service: Urology;  Laterality: Left;   LEFT HEART CATH AND CORONARY ANGIOGRAPHY Left 06/19/2023   Procedure: LEFT HEART CATH AND CORONARY ANGIOGRAPHY;  Surgeon: Florencio Cara BIRCH, MD;  Location: [facility] INVASIVE CV LAB;  Service: Cardiovascular;  Laterality: Left;   PALATE / UVULA BIOPSY / EXCISION  07/14/2015   uvula removed   Uvula Removed      Social History   Socioeconomic History   Marital status: Married    Spouse name: [name]   Number of children: Not on file   Years of education: Not on file   Highest education level: Not on file  Occupational History   Not on file  Tobacco Use   Smoking status: Former    Current packs/day: 1.00    Types:  Cigarettes   Smokeless tobacco: Never   Tobacco comments:    Smoked for about 30 years    Quit smoking 42 years ago.    Smoked 1 PPD at his heaviest.  Vaping Use   Vaping status: Never Used  Substance and Sexual Activity   Alcohol use: Yes    Comment: Seldom   Drug use: Never   Sexual activity: Not on file  Other Topics Concern   Not on file  Social History Narrative   Lives with wife, [name], and 2 indoor dogs.    Social Drivers of Corporate investment banker Strain: Low Risk  (04/29/2023)   Received from [facility]   Overall Financial Resource Strain (CARDIA)    Difficulty of Paying Living Expenses: Not hard at all  Food Insecurity: No Food Insecurity (06/19/2023)   Hunger Vital Sign    Worried About Running Out of Food in the Last Year: Never true    Ran Out of Food in the Last Year: Never true  Transportation Needs: No Transportation Needs (06/19/2023)   PRAPARE - Administrator, Civil Service (Medical): No    Lack of Transportation (Non-Medical): No  Physical Activity: Not on file  Stress: Not on file  Social Connections: Not on file  Intimate Partner Violence: Not At Risk (06/19/2023)   Humiliation, Afraid, Rape, and Kick questionnaire    Fear of Current or Ex-Partner: No    Emotionally Abused: No    Physically Abused: No    Sexually Abused: No     Allergies  Allergen Reactions   Atorvastatin Other (See Comments)    Joint pain   Penicillins Itching   Rosuvastatin Other (See Comments)    Joint pain     CBC    Component Value Date/Time   WBC 10.5 02/16/2024 0619   RBC 4.32 02/16/2024 0619   HGB 14.0 02/16/2024 0619   HCT 39.2 02/16/2024 0619   PLT 276 02/16/2024 0619   MCV 90.7 02/16/2024 0619   MCH 32.4 02/16/2024 0619   MCHC 35.7 02/16/2024 0619   RDW 11.9 02/16/2024 0619   LYMPHSABS 1.7 02/16/2024 0619   MONOABS 0.5 02/16/2024 0619   EOSABS 0.2 02/16/2024  9380   BASOSABS 0.1 02/16/2024 9380    Pulmonary Functions  Testing Results:     No data to display          Outpatient Medications Prior to Visit  Medication Sig Dispense Refill   acetaminophen  (TYLENOL ) 500 MG tablet Take 1,000 mg by mouth every 6 (six) hours as needed for moderate pain (pain score 4-6).     alfuzosin  (UROXATRAL ) 10 MG 24 hr tablet TAKE ONE TABLET BY MOUTH ONE TIME DAILY WITH BREAKFAST 30 tablet 3   aspirin  EC 81 MG tablet Take 81 mg by mouth daily. Swallow whole.     atomoxetine (STRATTERA) 40 MG capsule Take 80 mg by mouth every morning.     CONTOUR NEXT TEST test strip TEST BLOOD SUGAR TWICE A DAY     ezetimibe (ZETIA) 10 MG tablet Take 10 mg by mouth daily.     HYDROcodone -acetaminophen  (NORCO/VICODIN) 5-325 MG tablet Take 1-2 tablets by mouth every 4 (four) hours as needed for moderate pain (pain score 4-6) or severe pain (pain score 7-10). 20 tablet 0   metFORMIN (GLUCOPHAGE-XR) 750 MG 24 hr tablet Take 750 mg by mouth daily with breakfast.     metoprolol  succinate (TOPROL -XL) 25 MG 24 hr tablet Take 12.5 mg by mouth daily.     nitroGLYCERIN  (NITROSTAT ) 0.4 MG SL tablet Place 0.4 mg under the tongue every 5 (five) minutes as needed for chest pain.     ondansetron  (ZOFRAN -ODT) 4 MG disintegrating tablet Take 1 tablet (4 mg total) by mouth every 6 (six) hours as needed for nausea or vomiting. 20 tablet 0   pravastatin (PRAVACHOL) 40 MG tablet Take 40 mg by mouth at bedtime.     tadalafil  (CIALIS ) 20 MG tablet Take 1 tablet (20 mg total) by mouth daily as needed for erectile dysfunction. 90 tablet 3   tadalafil  (CIALIS ) 5 MG tablet Take 1 tablet (5 mg total) by mouth daily as needed for erectile dysfunction. (Patient taking differently: Take 5 mg by mouth daily.) 90 tablet 3   Vilazodone HCl (VIIBRYD) 40 MG TABS Take 40 mg by mouth daily.     No facility-administered medications prior to visit.

## 2024-02-28 NOTE — Procedures (Addendum)
 [facility] Diagnostic Evaluation Report  Danny Brown was seen today for audiological testing.  Service Delivery Location:  [facility] Time of service:   1130 Time out:    1200 Right ear pain:  none, 0/10 Left  ear pain:  none, 0/10    History  Primary complaint today is hearing loss and tinnitus.   Yes - if checked    Hearing loss: [x]  Noted difficulties in background noise  Tinnitus: [x]  Bilateral; White noise; constant; onset ~6 months   Aural Fullness: []    Otologic surgery: []    Dizziness/Vertigo: [x]  Has had h/o positional vertigo; will get imbalanced when going from sitting to standing, looking down, etc  Noise Exposure: [x]  Worked in [employer] department for 40 years  Family Hx of hearing loss: []    Amplification use: []    Other information      Audiogram      Pure Tone Threshold Results  Right ear:  hearing WNL 250 Hz - 3000 Hz with a mild to moderate sensorineural hearing loss 4000 Hz - 8000 Hz Left ear:    hearing WNL 250 Hz - 3000 Hz with a moderate sensorineural hearing loss 4000 Hz - 8000 Hz   Speech Testing Results  Word Recognition (performed using NU-6 Ordered by Difficulty recorded stimuli): Right ear:  excellent (>96%) which is consistent with pure tone results Left ear:    excellent  (>96%) which is consistent with pure tone results  Word recognition scores do not suggest a significant difference between ears   Immittance Results  Tympanometry revealed the following: Right ear:   WNL Left ear:    WNL   Patient Education  Patient and family education re: role of audiologist and communication were provided via verbal communication.  No barriers to education were identified. Patient and family verbally expressed understanding of information provided.    Recommendations  F/u with ordering provider, as planned. Consider ENT f/u due to history of vertigo and slight hearing asymmetry.  Hearing protection in noise.   Audiologic re-evaluation as clinically indicated; sooner if new concerns arise.

## 2024-03-02 ENCOUNTER — Ambulatory Visit: Admitting: Physician Assistant

## 2024-03-03 ENCOUNTER — Other Ambulatory Visit: Payer: Self-pay

## 2024-03-03 ENCOUNTER — Encounter
Admission: RE | Admit: 2024-03-03 | Discharge: 2024-03-03 | Disposition: A | Discharge status: Home / Self Care | Source: Ambulatory Visit | Attending: Urology | Admitting: Urology

## 2024-03-03 VITALS — Ht 63.0 in | Wt 143.0 lb

## 2024-03-03 DIAGNOSIS — Z01812 Encounter for preprocedural laboratory examination: Secondary | ICD-10-CM

## 2024-03-03 HISTORY — DX: Essential (primary) hypertension: I10

## 2024-03-03 HISTORY — DX: Mixed hyperlipidemia: E78.2

## 2024-03-03 HISTORY — DX: Pneumonia, unspecified organism: J18.9

## 2024-03-03 HISTORY — DX: Obstructive sleep apnea (adult) (pediatric): G47.33

## 2024-03-03 HISTORY — DX: Atherosclerotic heart disease of native coronary artery without angina pectoris: I25.10

## 2024-03-03 NOTE — Patient Instructions (Addendum)
 Your procedure is scheduled on: Monday, August 18 Report to the Registration Desk on the 1st floor of the [facility] To find out your arrival time, please call [phone] between 1PM - 3PM on: Friday, August 15 If your arrival time is 6:00 am, do not arrive before that time as the [facility] entrance doors do not open until 6:00 am.  REMEMBER: Instructions that are not followed completely may result in serious medical risk, up to and including death; or upon the discretion of your surgeon and anesthesiologist your surgery may need to be rescheduled.  Do not eat or drink after midnight the night before surgery.  No gum chewing or hard candies.  One week prior to surgery: Stop Anti-inflammatories (NSAIDS) such as Advil, Aleve, Ibuprofen, Motrin, Naproxen, Naprosyn and Aspirin  based products such as Excedrin, Goody's Powder, BC Powder. Stop ANY OVER THE COUNTER supplements until after surgery.  You may however, continue to take Tylenol  if needed for pain up until the day of surgery.  Per Dr. Marval note; you may continue taking the 81 mg aspirin .  Metformin - hold for 2 days before surgery. Last day to take is Friday, August 15. Resume AFTER surgery.  Tadalafil  (Cialis ) - hold for 2 days before surgery. Can cause significant low blood pressure during surgery.  Continue taking all of your other prescription medications up until the day of surgery.  ON THE DAY OF SURGERY ONLY TAKE THESE MEDICATIONS WITH SIPS OF WATER:  alfuzosin  (UROXATRAL )  ezetimibe (ZETIA)  metoprolol   Vilazodone   No Alcohol for 24 hours before or after surgery.  No Smoking including e-cigarettes for 24 hours before surgery.  No chewable tobacco products for at least 6 hours before surgery.  No nicotine patches on the day of surgery.  Do not use any recreational drugs for at least a week (preferably 2 weeks) before your surgery.  Please be advised that the combination of cocaine and anesthesia may  have negative outcomes, up to and including death. If you test positive for cocaine, your surgery will be cancelled.  On the morning of surgery brush your teeth with toothpaste and water, you may rinse your mouth with mouthwash if you wish. Do not swallow any toothpaste or mouthwash.  Do not wear jewelry, make-up, hairpins, clips or nail polish.  For welded (permanent) jewelry: bracelets, anklets, waist bands, etc.  Please have this removed prior to surgery.  If it is not removed, there is a chance that hospital personnel will need to cut it off on the day of surgery.  Do not wear lotions, powders, or perfumes.   Do not shave body hair from the neck down 48 hours before surgery.  Contact lenses, hearing aids and dentures may not be worn into surgery.  Do not bring valuables to the hospital. [facility] is not responsible for any missing/lost belongings or valuables.   Bring your C-PAP to the hospital in case you may have to spend the night.   Notify your doctor if there is any change in your medical condition (cold, fever, infection).  Wear comfortable clothing (specific to your surgery type) to the hospital.  After surgery, you can help prevent lung complications by doing breathing exercises.  Take deep breaths and cough every 1-2 hours.   If you are being discharged the day of surgery, you will not be allowed to drive home. You will need a responsible individual to drive you home and stay with you for 24 hours after surgery.  If you are taking public transportation, you will need to have a responsible individual with you.  Please call the Pre-admissions Testing Dept. at [phone] if you have any questions about these instructions.  Surgery Visitation Policy:  Patients having surgery or a procedure may have two visitors.  Children under the age of 46 must have an adult with them who is not the patient.   Merchandiser, retail to address health-related social  needs:  [email or web address]

## 2024-03-05 ENCOUNTER — Encounter: Payer: Self-pay | Admitting: Urology

## 2024-03-05 NOTE — Progress Notes (Signed)
 Perioperative / Anesthesia Services  Pre-Admission Testing Clinical Review / Pre-Operative Anesthesia Consult  Date: 03/05/24  PATIENT DEMOGRAPHICS: Name: Danny Brown DOB: October 23, 1955 MRN:   [id number]  Note: Available PAT nursing documentation and vital signs have been reviewed. Clinical nursing staff has updated patient's PMH/PSHx, current medication list, and drug allergies/intolerances to ensure complete and comprehensive history available to assist care teams in MDM as it pertains to the aforementioned surgical procedure and anticipated anesthetic course. Extensive review of available clinical information personally performed. [place] PMH and PSHx updated with any diagnoses/procedures that  may have been inadvertently omitted during his intake with the pre-admission testing department's nursing staff.  PLANNED SURGICAL PROCEDURE(S):   Case: [id number] Date/Time: 03/09/24 1508   Procedure: CYSTOSCOPY/URETEROSCOPY/HOLMIUM LASER/STENT PLACEMENT (Left) - EXCHANGE   Anesthesia type: General   Diagnosis: Left ureteral stone [N20.1]   Pre-op diagnosis: Left Ureteral Stone   Location: [facility] OR ROOM 10 / [facility]   Surgeons: Francisca Redell BROCKS, MD        CLINICAL DISCUSSION: Danny Brown is a 68 y.o. male who is submitted for pre-surgical anesthesia review and clearance prior to him undergoing the above procedure. Patient is a Former Games developer. Pertinent PMH includes: CAD, aortic atherosclerosis, cardiac murmur, palpitations, angina, HTN, HLD, T2DM, dyspnea, OSAH (on nocturnal PAP therapy), RML pulmonary nodule, GERD (no daily Tx), hiatal hernia, nephrolithiasis, ED (on PDE5i), Gilbert's syndrome, major depression.  Patient is followed by cardiology Philippe, MD). He was last seen in the cardiology clinic on ***; notes reviewed. ***At the time of his clinic visit, patient doing well overall from a cardiovascular perspective. Patient denied any chest pain, shortness of  breath, PND, orthopnea, palpitations, significant peripheral edema, weakness, fatigue, vertiginous symptoms, or presyncope/syncope. Patient with a past medical history significant for cardiovascular diagnoses. Documented physical exam was grossly benign, providing no evidence of acute exacerbation and/or decompensation of the patient's known cardiovascular conditions.  ***  Blood pressure***controlled at *** mmHg on currently prescribed *** therapies.  Patient is on *** for his HLD diagnosis and ASCVD prevention. ***Patient is not diabetic. ***He does not have an OSAH diagnosis. ***FC. No changes were made to his medication regimen during his visit with cardiology.  Patient scheduled to follow-up with outpatient cardiology in***months or sooner if needed.  Danny Brown is scheduled for an elective CYSTOSCOPY/URETEROSCOPY/HOLMIUM LASER/STENT PLACEMENT (Left) on 03/09/2024 with Dr. Redell Francisca, MD.  Given patient's past medical history significant for cardiovascular diagnoses, presurgical cardiac clearance was sought by the PAT team. ***   In review of the patient's chart, it is noted that he is on daily oral antithrombotic therapy. Given that patient's past medical history is significant for cardiovascular diagnoses, including but not limited to CAD, urology has cleared patient to continue his daily low dose ASA throughout his perioperative course.  Patient has been updated on these directives from his specialty care providers by the PAT team.  Patient denies previous perioperative complications with anesthesia in the past. In review his EMR, it is noted that patient underwent a general anesthetic course here at [facility] in 02/2024 without documented complications.   MOST RECENT VITAL SIGNS:    03/03/2024   12:41 PM 02/26/2024    1:19 PM 02/24/2024    3:31 PM  Vitals with BMI  Height 5' 3 5' 3   Weight 143 lbs 145 lbs 6 oz   BMI 25.34 25.76    Systolic  118 141  Diastolic  72 67  Pulse  67 74   PROVIDERS/SPECIALISTS: NOTE: Primary physician provider listed below. Patient may have been seen by APP or partner within same practice.   PROVIDER ROLE / SPECIALTY LAST SHERLEAN Francisca Redell JAYSON, MD Urology (Surgeon) 02/24/2024 evaluated with H&P preop  [facility] Primary Care Provider 02/19/2024  Florencio Shine, MD Cardiology 02/20/2024   Isadora Hose, MD  Pulmonary Medicine 02/26/2024   ALLERGIES: Allergies  Allergen Reactions   Atorvastatin Other (See Comments)    Joint pain   Penicillins Itching   Rosuvastatin Other (See Comments)    Joint pain    CURRENT HOME MEDICATIONS: No current facility-administered medications for this encounter.    alfuzosin  (UROXATRAL ) 10 MG 24 hr tablet   aspirin  EC 81 MG tablet   atomoxetine (STRATTERA) 40 MG capsule   ezetimibe (ZETIA) 10 MG tablet   metFORMIN (GLUCOPHAGE-XR) 750 MG 24 hr tablet   metoprolol  succinate (TOPROL -XL) 25 MG 24 hr tablet   NON FORMULARY   ondansetron  (ZOFRAN -ODT) 4 MG disintegrating tablet   pravastatin (PRAVACHOL) 40 MG tablet   Vilazodone HCl (VIIBRYD) 40 MG TABS   acetaminophen  (TYLENOL ) 500 MG tablet   CONTOUR NEXT TEST test strip   HYDROcodone -acetaminophen  (NORCO/VICODIN) 5-325 MG tablet   phenazopyridine (PYRIDIUM) 97 MG tablet   tadalafil  (CIALIS ) 20 MG tablet   tadalafil  (CIALIS ) 5 MG tablet   HISTORY: Past Medical History:  Diagnosis Date   Anginal pain (HCC)    Aortic atherosclerosis (HCC)    Coronary artery disease involving native coronary artery of native heart without angina pectoris    Dyspnea    Erectile dysfunction    a.) on PDE5i (tadalafil )   Essential hypertension    GERD (gastroesophageal reflux disease)    Bertrum syndrome    Heart murmur    Hypercholesterolemia    Left ureteral stone 02/2024   MDD (major depressive disorder)    Mixed hyperlipidemia    Obstructive sleep apnea on CPAP    Palpitations     Pneumonia    Right lower lobe pulmonary nodule 02/2024   T2DM (type 2 diabetes mellitus) (HCC)    Past Surgical History:  Procedure Laterality Date   COLONOSCOPY     CORONARY STENT INTERVENTION N/A 06/19/2023   Procedure: CORONARY STENT INTERVENTION;  Surgeon: Florencio Cara BIRCH, MD;  Location: [facility] INVASIVE CV LAB;  Service: Cardiovascular;  Laterality: N/A;   CYSTOSCOPY WITH RETROGRADE PYELOGRAM, URETEROSCOPY AND STENT PLACEMENT Left 02/24/2024   Procedure: CYSTOURETEROSCOPY, WITH RETROGRADE PYELOGRAM AND STENT INSERTION;  Surgeon: Francisca Redell JAYSON, MD;  Location: [facility] ORS;  Service: Urology;  Laterality: Left;   LEFT HEART CATH AND CORONARY ANGIOGRAPHY Left 06/19/2023   Procedure: LEFT HEART CATH AND CORONARY ANGIOGRAPHY;  Surgeon: Florencio Cara BIRCH, MD;  Location: [facility] INVASIVE CV LAB;  Service: Cardiovascular;  Laterality: Left;   PALATE / UVULA BIOPSY / EXCISION  07/14/2015   uvula removed   Uvula Removed     VASECTOMY     No family history on file. Social History   Tobacco Use   Smoking status: Former    Types: Cigarettes   Smokeless tobacco: Never   Tobacco comments:    Smoked for about 30 years    Quit smoking 42 years ago.    Smoked 1 PPD at his heaviest.  Substance Use Topics   Alcohol use: Yes    Comment: Seldom   LABS:  Admission on 02/24/2024, Discharged on 02/24/2024  Component Date Value Ref Range Status  Glucose-Capillary 02/24/2024 97  70 - 99 mg/dL Final   Glucose reference range applies only to samples taken after fasting for at least 8 hours.   Glucose-Capillary 02/24/2024 105 (H)  70 - 99 mg/dL Final   Glucose reference range applies only to samples taken after fasting for at least 8 hours.  Office Visit on 02/20/2024  Component Date Value Ref Range Status   Specific Gravity, UA 02/20/2024 1.020  1.005 - 1.030 Final   pH, UA 02/20/2024 6.0  5.0 - 7.5 Final   Color, UA 02/20/2024 Yellow  Yellow Final   Appearance Ur 02/20/2024 Clear  Clear Final    Leukocytes,UA 02/20/2024 Negative  Negative Final   Protein,UA 02/20/2024 Negative  Negative/Trace Final   Glucose, UA 02/20/2024 Negative  Negative Final   Ketones, UA 02/20/2024 Trace (A)  Negative Final   RBC, UA 02/20/2024 Negative  Negative Final   Bilirubin, UA 02/20/2024 Negative  Negative Final   Urobilinogen, Ur 02/20/2024 0.2  0.2 - 1.0 mg/dL Final   Nitrite, UA 92/68/7974 Negative  Negative Final   Microscopic Examination 02/20/2024 Comment   Final   Microscopic follows if indicated.   Microscopic Examination 02/20/2024 See below:   Final   Microscopic was indicated and was performed.   Urine Culture, Comprehensive 02/20/2024 Final report   Final   Organism ID, Bacteria 02/20/2024 Comment   Final   No growth in 36 - 48 hours.   WBC, UA 02/20/2024 0-5  0 - 5 /hpf Final   RBC, Urine 02/20/2024 3-10 (A)  0 - 2 /hpf Final   Epithelial Cells (non renal) 02/20/2024 0-10  0 - 10 /hpf Final   Bacteria, UA 02/20/2024 Few  None seen/Few Final  Admission on 02/16/2024, Discharged on 02/16/2024  Component Date Value Ref Range Status   Sodium 02/16/2024 137  135 - 145 mmol/L Final   Potassium 02/16/2024 4.4  3.5 - 5.1 mmol/L Final   Chloride 02/16/2024 101  98 - 111 mmol/L Final   CO2 02/16/2024 24  22 - 32 mmol/L Final   Glucose, Bld 02/16/2024 203 (H)  70 - 99 mg/dL Final   Glucose reference range applies only to samples taken after fasting for at least 8 hours.   BUN 02/16/2024 21  8 - 23 mg/dL Final   Creatinine, Ser 02/16/2024 1.13  0.61 - 1.24 mg/dL Final   Calcium 92/72/7974 9.5  8.9 - 10.3 mg/dL Final   Total Protein 92/72/7974 6.9  6.5 - 8.1 g/dL Final   Albumin 92/72/7974 4.1  3.5 - 5.0 g/dL Final   AST 92/72/7974 24  15 - 41 U/L Final   ALT 02/16/2024 29  0 - 44 U/L Final   Alkaline Phosphatase 02/16/2024 76  38 - 126 U/L Final   Total Bilirubin 02/16/2024 0.8  0.0 - 1.2 mg/dL Final   GFR, Estimated 02/16/2024 >60  >60 mL/min Final   Comment: (NOTE) Calculated using the  CKD-EPI Creatinine Equation (2021)    Anion gap 02/16/2024 12  5 - 15 Final   Performed at [facility] [place], [address] [place], [place] [address]   WBC 02/16/2024 10.5  4.0 - 10.5 K/uL Final   RBC 02/16/2024 4.32  4.22 - 5.81 MIL/uL Final   Hemoglobin 02/16/2024 14.0  13.0 - 17.0 g/dL Final   HCT 92/72/7974 39.2  39.0 - 52.0 % Final   MCV 02/16/2024 90.7  80.0 - 100.0 fL Final   MCH 02/16/2024 32.4  26.0 - 34.0 pg Final  MCHC 02/16/2024 35.7  30.0 - 36.0 g/dL Final   RDW 92/72/7974 11.9  11.5 - 15.5 % Final   Platelets 02/16/2024 276  150 - 400 K/uL Final   nRBC 02/16/2024 0.0  0.0 - 0.2 % Final   Neutrophils Relative % 02/16/2024 76  % Final   Neutro Abs 02/16/2024 8.1 (H)  1.7 - 7.7 K/uL Final   Lymphocytes Relative 02/16/2024 17  % Final   Lymphs Abs 02/16/2024 1.7  0.7 - 4.0 K/uL Final   Monocytes Relative 02/16/2024 4  % Final   Monocytes Absolute 02/16/2024 0.5  0.1 - 1.0 K/uL Final   Eosinophils Relative 02/16/2024 1  % Final   Eosinophils Absolute 02/16/2024 0.2  0.0 - 0.5 K/uL Final   Basophils Relative 02/16/2024 1  % Final   Basophils Absolute 02/16/2024 0.1  0.0 - 0.1 K/uL Final   Immature Granulocytes 02/16/2024 1  % Final   Abs Immature Granulocytes 02/16/2024 0.07  0.00 - 0.07 K/uL Final   Performed at [facility], [address] [place], [place] [address]   Color, Urine 02/16/2024 YELLOW (A)  YELLOW Final   APPearance 02/16/2024 HAZY (A)  CLEAR Final   Specific Gravity, Urine 02/16/2024 1.015  1.005 - 1.030 Final   pH 02/16/2024 7.0  5.0 - 8.0 Final   Glucose, UA 02/16/2024 150 (A)  NEGATIVE mg/dL Final   Hgb urine dipstick 02/16/2024 LARGE (A)  NEGATIVE Final   Bilirubin Urine 02/16/2024 NEGATIVE  NEGATIVE Final   Ketones, ur 02/16/2024 NEGATIVE  NEGATIVE mg/dL Final   Protein, ur 92/72/7974 30 (A)  NEGATIVE mg/dL Final   Nitrite 92/72/7974 NEGATIVE  NEGATIVE Final   Leukocytes,Ua 02/16/2024 NEGATIVE  NEGATIVE Final   RBC / HPF  02/16/2024 >50  0 - 5 RBC/hpf Final   WBC, UA 02/16/2024 21-50  0 - 5 WBC/hpf Final   Bacteria, UA 02/16/2024 NONE SEEN  NONE SEEN Final   Squamous Epithelial / HPF 02/16/2024 0  0 - 5 /HPF Final   Mucus 02/16/2024 PRESENT   Final   Performed at [facility], [address], [place], [place] [address]   Specimen Description 02/16/2024    Final                   Value:URINE, CLEAN CATCH Performed at [facility], [address] [place], [place] [address]    Special Requests 02/16/2024    Final                   Value:NONE Performed at [facility], [address], [place], [place] [address]    Culture 02/16/2024    Final                   Value:NO GROWTH Performed at [facility], [address] [place], [place] [address]    Report Status 02/16/2024 02/17/2024 FINAL   Final    ECG: Date: 01/10/2024  Time ECG obtained: 0757 AM Rate: 74 bpm Rhythm: normal sinus Axis (leads I and aVF): normal Intervals: PR 148 ms. QRS 70 ms. QTc 408 ms. ST segment and T wave changes: No evidence of acute T wave abnormalities or significant ST segment elevation or depression.  Evidence of a possible, age undetermined, prior infarct:  No Comparison: previous tracing obtained on 04/29/2023 showed sinus tachycardia with PACs at a rate of 103 bpm. There was borderline criteria for an inferior infarction noted. NOTE: Tracing obtained at [facility];  unable for review. Above based on cardiologist's interpretation.    IMAGING / PROCEDURES: CT RENAL STONE STUDY performed on 02/16/2024 9 x 6 x 7 mm left UPJ stone with mild to moderate left hydronephrosis and prominent left perinephric edema. Punctate nonobstructing stone upper pole left kidney. Tiny hiatal hernia. Tiny 2-3 mm right lower lobe nodule is new in the interval since CT chest 06/11/2023. No follow-up needed if patient is low-risk.This recommendation follows the consensus statement: Guidelines for  Management of Incidental Pulmonary Nodules Detected on CT Images: From the Fleischner Society 2017; Radiology 2017; 284:228-243 Aortic atherosclerosis  TRANSTHORACIC ECHOCARDIOGRAM performed on 01/27/2024 Normal left ventricular systolic function with an EF of >55% No LVH No regional wall motion abnormalities Left ventricular diastolic Doppler parameters consistent with abnormal relaxation (G1DD). Right ventricular size and function normal Mild pan valvular regurgitation RVSP = 27 mmHg Normal gradients; no valvular stenosis  LEFT HEART CATHETERIZATION AND CORONARY ANGIOGRAPHY performed on 06/19/2023 Normal left ventricular systolic function with EF of 55 to 65% Normal LVEDP Single-vessel CAD with 95% lesion in the proximal-mid LAD and 50% lesion in the mid LAD. Successful PCI to the mid LAD placing a 2.5 x 26 mm Frontier Onyx DES.  Procedure yielded excellent angiographic result and TIMI-3 flow.   CT CORONARY MORPH W/CTA COR W/SCORE W/CA W/CM &/OR WO/CM AND FFR DATA PREP & FLUID ANALYSIS performed on 06/17/2023 Coronary calcium score of 17.5. This was 25th percentile for age and sex matched control. Normal coronary origin with right dominance. Severe proximal LAD stenosis (>70%). Moderate proximal RCA stenosis (50-69%). CAD-RADS 4 Severe stenosis. (70-99% or > 50% left main). Cardiac catheterization is recommended. Consider symptom-guided anti-ischemic pharmacotherapy as well as risk factor modification per guideline directed care. FFR analysis (ranges: < 0.75 high likelihood of hemodynamically significant stenosis, 0.76-0.80 borderline, > 0.80 normal): Left Main:  No significant stenosis. LAD: Proximal-mid LAD not analyzed due to motion artifacts. LCX: No significant stenosis.  FFRct 0.93 RCA: No significant stenosis.  FFRct 0.89   MYOCARDIAL PERFUSION IMAGING STUDY (LEXISCAN) performed on 04/14/2021 Normal left ventricular systolic function with hyperdynamic LVEF of 72% No  regional wall motion abnormalities SPECT images demonstrate homogenous tracer distribution throughout the myocardium No evidence of stress-induced myocardial ischemia or arrhythmia; no scintigraphic evidence of scar Normal low restudy  IMPRESSION AND PLAN: Danny Brown has been referred for pre-anesthesia review and clearance prior to him undergoing the planned anesthetic and procedural courses. Available labs, pertinent testing, and imaging results were personally reviewed by me in preparation for upcoming operative/procedural course. [facility] medical record has been updated following extensive record review and patient interview with PAT staff.   ATTENTION --> PENDING CLEARANCE AT THIS TIME -- NOTE/CONTENTS NOT FINAL UNTIL SIGNED This patient has been appropriately cleared by cardiology with an overall *** risk of patient experiencing significant perioperative cardiovascular complications. Based on clinical review performed today (03/05/24), barring any significant acute changes in the patient's overall condition, it is anticipated that he will be able to proceed with the planned surgical intervention. Any acute changes in clinical condition may necessitate his procedure being postponed and/or cancelled. Patient will meet with anesthesia team (MD and/or CRNA) on the day of his procedure for preoperative evaluation/assessment. Questions regarding anesthetic course will be fielded at that time.   Pre-surgical instructions were reviewed with the patient during his PAT appointment, and questions were fielded to satisfaction by PAT clinical staff. He has been instructed on which medications that he will need to hold prior to surgery,  as well as the ones that have been deemed safe/appropriate to take on the day of his procedure. As part of the general education provided by PAT, patient made aware both verbally and in writing, that he would need to abstain from the use of any illegal substances during his  perioperative course. He was advised that failure to follow the provided instructions could necessitate case cancellation or result in serious perioperative complications up to and including death. Patient encouraged to contact PAT and/or his surgeon's office to discuss any questions or concerns that may arise prior to surgery; verbalized understanding.   Dorise Pereyra, MSN, APRN, FNP-C, CEN [facility]  Perioperative Services Nurse Practitioner Phone: [phone] Fax: [phone] 03/05/24 1:28 PM  NOTE: This note has been prepared using Scientist, clinical (histocompatibility and immunogenetics). Despite my best ability to proofread, there is always the potential that unintentional transcriptional errors may still occur from this process.

## 2024-03-06 NOTE — Anesthesia Preprocedure Evaluation (Signed)
 Anesthesia Evaluation  Patient identified by MRN, date of birth, ID band Patient awake    Reviewed: Allergy & Precautions, H&P , NPO status , Patient's Chart, lab work & pertinent test results, reviewed documented beta blocker date and time   History of Anesthesia Complications Negative for: history of anesthetic complications  Airway Mallampati: I  TM Distance: >3 FB Neck ROM: full    Dental  (+) Dental Advidsory Given, Missing, Teeth Intact   Pulmonary neg shortness of breath, sleep apnea and Continuous Positive Airway Pressure Ventilation , neg COPD, neg recent URI, former smoker   Pulmonary exam normal breath sounds clear to auscultation       Cardiovascular Exercise Tolerance: Good hypertension, Pt. on home beta blockers and Pt. on medications (-) angina + CAD and + Cardiac Stents  (-) Past MI Normal cardiovascular exam(-) dysrhythmias + Valvular Problems/Murmurs  Rhythm:regular Rate:Normal  ECHO 01/27/24: NORMAL LEFT VENTRICULAR SYSTOLIC FUNCTION WITH NO LVH  ESTIMATED EF: >55%  NORMAL LA PRESSURES WITH DIASTOLIC DYSFUNCTION (GRADE 1)  NORMAL RIGHT VENTRICULAR SYSTOLIC FUNCTION  VALVULAR REGURGITATION: MILD AR, MILD MR, MILD PR, MILD TR  ESTIMATED RVSP: 27 mmHg  NO VALVULAR STENOSIS     Neuro/Psych negative neurological ROS  negative psych ROS   GI/Hepatic Neg liver ROS,GERD  Controlled,,  Endo/Other  diabetes, Type 2, Oral Hypoglycemic Agents    Renal/GU negative Renal ROS  negative genitourinary   Musculoskeletal   Abdominal   Peds  Hematology negative hematology ROS (+)   Anesthesia Other Findings Past Medical History: No date: Diabetes mellitus without complication (HCC) No date: Dyspnea No date: GERD (gastroesophageal reflux disease) 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 No date: Heart murmur No date: Hypercholesterolemia No date: Hyperlipidemia No date: Hypertension No date: Hypochloremia No date: Sleep  apnea   Reproductive/Obstetrics negative OB ROS                              Anesthesia Physical Anesthesia Plan  ASA: 3  Anesthesia Plan: General   Post-op Pain Management:    Induction: Intravenous  PONV Risk Score and Plan: 2 and Ondansetron , Dexamethasone  and Treatment may vary due to age or medical condition  Airway Management Planned: Oral ETT  Additional Equipment:   Intra-op Plan:   Post-operative Plan: Extubation in OR  Informed Consent: I have reviewed the patients History and Physical, chart, labs and discussed the procedure including the risks, benefits and alternatives for the proposed anesthesia with the patient or authorized representative who has indicated his/her understanding and acceptance.     Dental Advisory Given  Plan Discussed with: Anesthesiologist, CRNA and Surgeon  Anesthesia Plan Comments:          Anesthesia Quick Evaluation

## 2024-03-09 ENCOUNTER — Other Ambulatory Visit: Payer: Self-pay

## 2024-03-09 ENCOUNTER — Ambulatory Visit
Admission: RE | Admit: 2024-03-09 | Discharge: 2024-03-09 | Disposition: A | Discharge status: Home / Self Care | Attending: Urology | Admitting: Urology

## 2024-03-09 ENCOUNTER — Encounter: Payer: Self-pay | Admitting: Urology

## 2024-03-09 ENCOUNTER — Ambulatory Visit

## 2024-03-09 ENCOUNTER — Encounter
Admission: RE | Disposition: A | Discharge status: Home / Self Care | Payer: Self-pay | Source: Home / Self Care | Attending: Urology

## 2024-03-09 ENCOUNTER — Ambulatory Visit: Payer: Self-pay | Admitting: Urgent Care

## 2024-03-09 DIAGNOSIS — Z955 Presence of coronary angioplasty implant and graft: Secondary | ICD-10-CM | POA: Insufficient documentation

## 2024-03-09 DIAGNOSIS — I10 Essential (primary) hypertension: Secondary | ICD-10-CM | POA: Diagnosis not present

## 2024-03-09 DIAGNOSIS — K219 Gastro-esophageal reflux disease without esophagitis: Secondary | ICD-10-CM | POA: Insufficient documentation

## 2024-03-09 DIAGNOSIS — E119 Type 2 diabetes mellitus without complications: Secondary | ICD-10-CM | POA: Insufficient documentation

## 2024-03-09 DIAGNOSIS — N201 Calculus of ureter: Secondary | ICD-10-CM | POA: Diagnosis not present

## 2024-03-09 DIAGNOSIS — N132 Hydronephrosis with renal and ureteral calculous obstruction: Secondary | ICD-10-CM | POA: Diagnosis not present

## 2024-03-09 DIAGNOSIS — I2510 Atherosclerotic heart disease of native coronary artery without angina pectoris: Secondary | ICD-10-CM | POA: Diagnosis not present

## 2024-03-09 DIAGNOSIS — G4733 Obstructive sleep apnea (adult) (pediatric): Secondary | ICD-10-CM | POA: Insufficient documentation

## 2024-03-09 DIAGNOSIS — Z87891 Personal history of nicotine dependence: Secondary | ICD-10-CM | POA: Diagnosis not present

## 2024-03-09 DIAGNOSIS — Z01812 Encounter for preprocedural laboratory examination: Secondary | ICD-10-CM

## 2024-03-09 DIAGNOSIS — Z7984 Long term (current) use of oral hypoglycemic drugs: Secondary | ICD-10-CM | POA: Insufficient documentation

## 2024-03-09 HISTORY — DX: Tinnitus, unspecified ear: H93.19

## 2024-03-09 HISTORY — DX: Palpitations: R00.2

## 2024-03-09 HISTORY — PX: CYSTOSCOPY W/ RETROGRADES: SHX1426

## 2024-03-09 HISTORY — DX: Angina pectoris, unspecified: I20.9

## 2024-03-09 HISTORY — DX: Male erectile dysfunction, unspecified: N52.9

## 2024-03-09 HISTORY — DX: Diaphragmatic hernia without obstruction or gangrene: K44.9

## 2024-03-09 HISTORY — PX: CYSTOSCOPY/URETEROSCOPY/HOLMIUM LASER/STENT PLACEMENT: SHX6546

## 2024-03-09 HISTORY — DX: Unspecified sensorineural hearing loss: H90.5

## 2024-03-09 HISTORY — DX: Major depressive disorder, single episode, unspecified: F32.9

## 2024-03-09 HISTORY — DX: Type 2 diabetes mellitus without complications: E11.9

## 2024-03-09 HISTORY — DX: Atherosclerosis of aorta: I70.0

## 2024-03-09 LAB — GLUCOSE, CAPILLARY
Glucose-Capillary: 112 mg/dL — ABNORMAL HIGH (ref 70–99)
Glucose-Capillary: 132 mg/dL — ABNORMAL HIGH (ref 70–99)

## 2024-03-09 SURGERY — CYSTOSCOPY/URETEROSCOPY/HOLMIUM LASER/STENT PLACEMENT
Anesthesia: General | Site: Ureter

## 2024-03-09 MED ORDER — ONDANSETRON HCL 4 MG/2ML IJ SOLN
INTRAMUSCULAR | Status: DC | PRN
  Administered 2024-03-09 (×2): 4 mg via INTRAVENOUS

## 2024-03-09 MED ORDER — CELECOXIB 200 MG PO CAPS
ORAL_CAPSULE | ORAL | Status: AC
  Filled 2024-03-09: qty 1

## 2024-03-09 MED ORDER — PHENYLEPHRINE 80 MCG/ML (10ML) SYRINGE FOR IV PUSH (FOR BLOOD PRESSURE SUPPORT)
PREFILLED_SYRINGE | INTRAVENOUS | Status: DC | PRN
  Administered 2024-03-09 (×3): 80 ug via INTRAVENOUS

## 2024-03-09 MED ORDER — IOHEXOL 180 MG/ML  SOLN
INTRAMUSCULAR | Status: DC | PRN
  Administered 2024-03-09: 10 mL

## 2024-03-09 MED ORDER — FENTANYL CITRATE (PF) 100 MCG/2ML IJ SOLN
INTRAMUSCULAR | Status: AC
  Filled 2024-03-09: qty 2

## 2024-03-09 MED ORDER — OXYCODONE HCL 5 MG PO TABS: 5.0000 mg | ORAL_TABLET | Freq: Once | ORAL | Status: DC | PRN

## 2024-03-09 MED ORDER — DROPERIDOL 2.5 MG/ML IJ SOLN: 0.6250 mg | Freq: Once | INTRAMUSCULAR | Status: DC | PRN

## 2024-03-09 MED ORDER — STERILE WATER FOR IRRIGATION IR SOLN
Status: DC | PRN
  Administered 2024-03-09: 500 mL

## 2024-03-09 MED ORDER — SODIUM CHLORIDE 0.9 % IV SOLN: INTRAVENOUS | Status: DC

## 2024-03-09 MED ORDER — ACETAMINOPHEN 10 MG/ML IV SOLN: 1000.0000 mg | Freq: Once | INTRAVENOUS | Status: DC | PRN

## 2024-03-09 MED ORDER — PROPOFOL 10 MG/ML IV BOLUS
INTRAVENOUS | Status: DC | PRN
  Administered 2024-03-09: 200 mg via INTRAVENOUS

## 2024-03-09 MED ORDER — OXYCODONE HCL 5 MG/5ML PO SOLN: 5.0000 mg | Freq: Once | ORAL | Status: DC | PRN

## 2024-03-09 MED ORDER — ROCURONIUM BROMIDE 100 MG/10ML IV SOLN
INTRAVENOUS | Status: DC | PRN
  Administered 2024-03-09: 40 mg via INTRAVENOUS
  Administered 2024-03-09: 10 mg via INTRAVENOUS

## 2024-03-09 MED ORDER — FENTANYL CITRATE (PF) 100 MCG/2ML IJ SOLN: 25.0000 ug | INTRAMUSCULAR | Status: DC | PRN

## 2024-03-09 MED ORDER — SODIUM CHLORIDE 0.9 % IV BOLUS
1000.0000 mL | Freq: Once | INTRAVENOUS | Status: AC
  Administered 2024-03-09: 1000 mL via INTRAVENOUS

## 2024-03-09 MED ORDER — CHLORHEXIDINE GLUCONATE 0.12 % MT SOLN
15.0000 mL | Freq: Once | OROMUCOSAL | Status: AC
  Administered 2024-03-09: 15 mL via OROMUCOSAL

## 2024-03-09 MED ORDER — SUCCINYLCHOLINE CHLORIDE 200 MG/10ML IV SOSY
PREFILLED_SYRINGE | INTRAVENOUS | Status: DC | PRN
  Administered 2024-03-09: 100 mg via INTRAVENOUS

## 2024-03-09 MED ORDER — CELECOXIB 200 MG PO CAPS
200.0000 mg | ORAL_CAPSULE | Freq: Once | ORAL | Status: AC
  Administered 2024-03-09: 200 mg via ORAL

## 2024-03-09 MED ORDER — CHLORHEXIDINE GLUCONATE 0.12 % MT SOLN
OROMUCOSAL | Status: AC
  Filled 2024-03-09: qty 15

## 2024-03-09 MED ORDER — SODIUM CHLORIDE 0.9 % IR SOLN
Status: DC | PRN
  Administered 2024-03-09: 1500 mL

## 2024-03-09 MED ORDER — KETOROLAC TROMETHAMINE 30 MG/ML IJ SOLN
INTRAMUSCULAR | Status: DC | PRN
  Administered 2024-03-09: 15 mg via INTRAVENOUS

## 2024-03-09 MED ORDER — DEXMEDETOMIDINE HCL IN NACL 200 MCG/50ML IV SOLN
INTRAVENOUS | Status: DC | PRN
  Administered 2024-03-09: 8 ug via INTRAVENOUS

## 2024-03-09 MED ORDER — LIDOCAINE HCL (CARDIAC) PF 100 MG/5ML IV SOSY
PREFILLED_SYRINGE | INTRAVENOUS | Status: DC | PRN
  Administered 2024-03-09: 100 mg via INTRAVENOUS

## 2024-03-09 MED ORDER — TRAMADOL HCL 50 MG PO TABS: 25.0000 mg | ORAL_TABLET | Freq: Four times a day (QID) | ORAL | 0 refills | Status: AC | PRN

## 2024-03-09 MED ORDER — ORAL CARE MOUTH RINSE: 15.0000 mL | Freq: Once | OROMUCOSAL | Status: AC

## 2024-03-09 MED ORDER — CIPROFLOXACIN IN D5W 400 MG/200ML IV SOLN
INTRAVENOUS | Status: AC
  Filled 2024-03-09: qty 200

## 2024-03-09 MED ORDER — ACETAMINOPHEN 500 MG PO TABS
ORAL_TABLET | ORAL | Status: AC
  Filled 2024-03-09: qty 2

## 2024-03-09 MED ORDER — MIDAZOLAM HCL 2 MG/2ML IJ SOLN
INTRAMUSCULAR | Status: DC | PRN
  Administered 2024-03-09: 2 mg via INTRAVENOUS

## 2024-03-09 MED ORDER — FENTANYL CITRATE (PF) 100 MCG/2ML IJ SOLN
INTRAMUSCULAR | Status: DC | PRN
  Administered 2024-03-09 (×2): 50 ug via INTRAVENOUS

## 2024-03-09 MED ORDER — DEXAMETHASONE SODIUM PHOSPHATE 10 MG/ML IJ SOLN
INTRAMUSCULAR | Status: DC | PRN
  Administered 2024-03-09: 5 mg via INTRAVENOUS

## 2024-03-09 MED ORDER — CEPHALEXIN 500 MG PO CAPS: 500.0000 mg | ORAL_CAPSULE | Freq: Once | ORAL | 0 refills | Status: DC | PRN

## 2024-03-09 MED ORDER — MIDAZOLAM HCL 2 MG/2ML IJ SOLN
INTRAMUSCULAR | Status: AC
  Filled 2024-03-09: qty 2

## 2024-03-09 MED ORDER — CIPROFLOXACIN IN D5W 400 MG/200ML IV SOLN
400.0000 mg | INTRAVENOUS | Status: AC
  Administered 2024-03-09: 400 mg via INTRAVENOUS

## 2024-03-09 MED ORDER — GLYCOPYRROLATE 0.2 MG/ML IJ SOLN
INTRAMUSCULAR | Status: DC | PRN
  Administered 2024-03-09: .2 mg via INTRAVENOUS

## 2024-03-09 MED ORDER — EPHEDRINE SULFATE-NACL 50-0.9 MG/10ML-% IV SOSY
PREFILLED_SYRINGE | INTRAVENOUS | Status: DC | PRN
  Administered 2024-03-09: 10 mg via INTRAVENOUS

## 2024-03-09 MED ORDER — SUGAMMADEX SODIUM 200 MG/2ML IV SOLN
INTRAVENOUS | Status: DC | PRN
  Administered 2024-03-09: 200 mg via INTRAVENOUS

## 2024-03-09 MED ORDER — ACETAMINOPHEN 500 MG PO TABS
1000.0000 mg | ORAL_TABLET | Freq: Once | ORAL | Status: AC
  Administered 2024-03-09: 1000 mg via ORAL

## 2024-03-09 SURGICAL SUPPLY — 25 items
ADHESIVE MASTISOL STRL (MISCELLANEOUS) IMPLANT
BAG DRAIN SIEMENS DORNER NS (MISCELLANEOUS) ×2 IMPLANT
BAG PRESSURE INF REUSE 3000 (BAG) ×2 IMPLANT
BRUSH SCRUB EZ 4% CHG (MISCELLANEOUS) ×2 IMPLANT
CATH URET FLEX-TIP 2 LUMEN 10F (CATHETERS) IMPLANT
CATH URETL OPEN 5X70 (CATHETERS) IMPLANT
CNTNR URN SCR LID CUP LEK RST (MISCELLANEOUS) IMPLANT
DRAPE UTILITY 15X26 TOWEL STRL (DRAPES) ×2 IMPLANT
DRSG TEGADERM 2-3/8X2-3/4 SM (GAUZE/BANDAGES/DRESSINGS) IMPLANT
FIBER LASER MOSES 200 DFL (Laser) IMPLANT
FIBER LASER MOSES 365 DFL (Laser) IMPLANT
GOWN STRL REUS W/ TWL LRG LVL3 (GOWN DISPOSABLE) ×2 IMPLANT
GOWN STRL REUS W/ TWL XL LVL3 (GOWN DISPOSABLE) ×2 IMPLANT
GUIDEWIRE STR DUAL SENSOR (WIRE) ×2 IMPLANT
KIT TURNOVER CYSTO (KITS) ×2 IMPLANT
PACK CYSTO AR (MISCELLANEOUS) ×2 IMPLANT
SET CYSTO W/LG BORE CLAMP LF (SET/KITS/TRAYS/PACK) ×2 IMPLANT
SHEATH NAVIGATOR HD 12/14X36 (SHEATH) IMPLANT
SOL .9 NS 3000ML IRR UROMATIC (IV SOLUTION) ×2 IMPLANT
STENT URET 6FRX24 CONTOUR (STENTS) IMPLANT
STENT URET 6FRX26 CONTOUR (STENTS) IMPLANT
SURGILUBE 2OZ TUBE FLIPTOP (MISCELLANEOUS) ×2 IMPLANT
SYR 10ML LL (SYRINGE) ×2 IMPLANT
VALVE UROSEAL ADJ ENDO (VALVE) IMPLANT
WATER STERILE IRR 500ML POUR (IV SOLUTION) ×2 IMPLANT

## 2024-03-09 NOTE — Op Note (Signed)
 Date of procedure: 03/09/24  Preoperative diagnosis:  Left ureteral stone  Postoperative diagnosis:  Same  Procedure: Cystoscopy, left ureteroscopy, laser lithotripsy, left retrograde pyelogram with intraoperative interpretation, left ureteral stent placement  Surgeon: Redell Burnet, MD  Anesthesia: General  Complications: None  Intraoperative findings:  Normal bladder Persistent tight and narrow area in the mid left ureter, no tumors or stricture noted under direct vision Uncomplicated dusting of 7 mm left proximal ureteral stone and stent placement with Dangler  EBL: Minimal  Specimens: None  Drains: Left 6 French by 24 cm ureteral stent  Indication: Danny Brown is a 68 y.o. patient with 7 mm left proximal ureteral stone who was previously stented for inability to access the stone and here today for definitive management with ureteroscopy.  After reviewing the management options for treatment, they elected to proceed with the above surgical procedure(s). We have discussed the potential benefits and risks of the procedure, side effects of the proposed treatment, the likelihood of the patient achieving the goals of the procedure, and any potential problems that might occur during the procedure or recuperation. Informed consent has been obtained.  Description of procedure:  The patient was taken to the operating room and general anesthesia was induced. SCDs were placed for DVT prophylaxis. The patient was placed in the dorsal lithotomy position, prepped and draped in the usual sterile fashion, and preoperative antibiotic were administered. A preoperative time-out was performed.   A 21 French rigid cystoscope was used to intubate the urethra and a normal-appearing urethra probably proximally in the bladder.  Prostate was moderate in size.  Thorough cystoscopy showed no suspicious lesions.  A sensor wire was advanced into the left ureteral orifice alongside the stent up to the  kidney under fluoroscopic vision.  The stent was grasped and pulled to the meatus and a second safety sensor wire was passed through the stent up to the kidney and the old stent removed.  A digital single-channel flexible ureteroscope was advanced over the wire towards the kidney.  This again met resistance in the mid ureter, but ultimately I was able to navigate this over the wire up to the proximal stone.  Under direct vision there was a yellow 7 mm stone in the proximal ureter.  A 200 m laser fiber on settings of 0.5 J and 80 Hz was used to methodically dust the stone.  No stone fragments were larger than the laser fiber.  Pyeloscopy showed no other stone fragments.  Retrograde pyelogram was performed from the proximal ureter and showed no extravasation or filling defects.  Careful pullback ureteroscopy showed no evidence of ureteral injury or residual fragments.  There was a narrow area in the mid ureter, but no evidence of tumor and the scope was able to navigate this area.  A rigid cystoscope was backloaded over the wire and a 6 [place] by 24 cm ureteral stent was placed uneventfully with a curl in the kidney as well as in the bladder.  The bladder was drained, Dangler was secured to the dorsal aspect of the penis with Mastisol and Tegaderm.  Disposition: Stable to PACU  Plan: He will remove his stent at home on Friday morning  Redell Burnet, MD

## 2024-03-09 NOTE — Anesthesia Procedure Notes (Signed)
 Procedure Name: Intubation Date/Time: 03/09/2024 1:39 PM  Performed by: Ledora Duncan, CRNAPre-anesthesia Checklist: Patient identified, Emergency Drugs available, Suction available and Patient being monitored Patient Re-evaluated:Patient Re-evaluated prior to induction Oxygen Delivery Method: Circle system utilized Preoxygenation: Pre-oxygenation with 100% oxygen Induction Type: IV induction Ventilation: Mask ventilation without difficulty Laryngoscope Size: McGrath and 3 Grade View: Grade I Tube type: Oral Tube size: 7.0 mm Number of attempts: 1 Airway Equipment and Method: Stylet Placement Confirmation: ETT inserted through vocal cords under direct vision, positive ETCO2 and breath sounds checked- equal and bilateral Secured at: 21 cm Tube secured with: Tape Dental Injury: Teeth and Oropharynx as per pre-operative assessment

## 2024-03-09 NOTE — H&P (Signed)
   03/09/24 1:22 PM   Danny Brown [birth year] [id number]  CC: Left ureteral stone  HPI: 68 year old male who previously presented with a 7 mm left proximal ureteral stone, unable to access the stone secondary to a narrow ureter at initial procedure on 02/24/2024, here today for definitive treatment with ureteroscopy, laser lithotripsy, stent placement.  PMH: Past Medical History:  Diagnosis Date   Anginal pain (HCC)    Aortic atherosclerosis (HCC)    Coronary artery disease involving native coronary artery of native heart without angina pectoris    a.) LHC/PCI 06/19/2023: 95% ulcerated lesion p-mLAD, 50% mid LAD --> (2.5 x 26 mm Frontier Onyx DES mLAD)   Dyspnea    Erectile dysfunction    a.) on PDE5i (tadalafil )   Essential hypertension    GERD (gastroesophageal reflux disease)    Gilbert syndrome    Heart murmur    Hiatal hernia    Hypercholesterolemia    Left ureteral stone 02/2024   MDD (major depressive disorder)    Mixed hyperlipidemia    Obstructive sleep apnea on CPAP    Palpitations    Pneumonia    Right lower lobe pulmonary nodule 02/2024   SNHL (sensorineural hearing loss)    T2DM (type 2 diabetes mellitus) (HCC)    Tinnitus     Surgical History: Past Surgical History:  Procedure Laterality Date   COLONOSCOPY     CORONARY STENT INTERVENTION N/A 06/19/2023   Procedure: CORONARY STENT INTERVENTION;  Surgeon: Florencio Cara BIRCH, MD;  Location: [facility];  Service: Cardiovascular;  Laterality: N/A;   CYSTOSCOPY WITH RETROGRADE PYELOGRAM, URETEROSCOPY AND STENT PLACEMENT Left 02/24/2024   Procedure: CYSTOURETEROSCOPY, WITH RETROGRADE PYELOGRAM AND STENT INSERTION;  Surgeon: Francisca Redell BROCKS, MD;  Location: [facility];  Service: Urology;  Laterality: Left;   LEFT HEART CATH AND CORONARY ANGIOGRAPHY Left 06/19/2023   Procedure: LEFT HEART CATH AND CORONARY ANGIOGRAPHY;  Surgeon: Florencio Cara BIRCH, MD;  Location: [facility];  Service:  Cardiovascular;  Laterality: Left;   PALATE / UVULA BIOPSY / EXCISION  07/14/2015   uvula removed   Uvula Removed     VASECTOMY        Family History: History reviewed. No pertinent family history.  Social History:  reports that he has quit smoking. His smoking use included cigarettes. He has never used smokeless tobacco. He reports current alcohol use. He reports that he does not use drugs.  Physical Exam: BP 127/75   Pulse 80   Temp 97.6 F (36.4 C) (Temporal)   Resp 17   Ht 5' 3 (1.6 m)   Wt 64.9 kg   SpO2 99%   BMI 25.33 kg/m    Constitutional:  Alert and oriented, No acute distress. Cardiovascular: Regular rate and rhythm Respiratory: Clear to auscultation bilaterally GI: Abdomen is soft, nontender, nondistended, no abdominal masses  Laboratory Data: Culture 7/31 no growth   Assessment & Plan:   68 year old male with 7 mm left proximal ureteral stone, here today for definitive treatment with ureteroscopy after passive dilation with stent for narrow ureter.  We specifically discussed the risks ureteroscopy including bleeding, infection/sepsis, stent related symptoms including flank pain/urgency/frequency/incontinence/dysuria, ureteral injury, ureteral stricture, inability to access stone, or need for staged or additional procedures.  Left ureteroscopy, laser lithotripsy, stent placement   Redell Francisca, MD 03/09/2024  [facility] [address], [address] [place], [place] [address] [phone]

## 2024-03-09 NOTE — Transfer of Care (Signed)
 Immediate Anesthesia Transfer of Care Note  Patient: Danny Brown  Procedure(s) Performed: CYSTOSCOPY/URETEROSCOPY/HOLMIUM LASER/STENT PLACEMENT (Left: Ureter) CYSTOSCOPY, WITH RETROGRADE PYELOGRAM  Patient Location: PACU  Anesthesia Type:General  Level of Consciousness: drowsy and patient cooperative  Airway & Oxygen Therapy: Patient Spontanous Breathing and Patient connected to face mask oxygen  Post-op Assessment: Report given to RN and Post -op Vital signs reviewed and stable  Post vital signs: Reviewed and stable  Last Vitals:  Vitals Value Taken Time  BP    Temp    Pulse 71 03/09/24 14:19  Resp 17 03/09/24 14:19  SpO2 94 % 03/09/24 14:19  Vitals shown include unfiled device data.  Last Pain:  Vitals:   03/09/24 1225  TempSrc: Temporal  PainSc: 0-No pain         Complications: No notable events documented.

## 2024-03-09 NOTE — Anesthesia Postprocedure Evaluation (Signed)
 Anesthesia Post Note  Patient: Danny Brown  Procedure(s) Performed: CYSTOSCOPY/URETEROSCOPY/HOLMIUM LASER/STENT PLACEMENT (Left: Ureter) CYSTOSCOPY, WITH RETROGRADE PYELOGRAM  Patient location during evaluation: PACU Anesthesia Type: General Level of consciousness: awake and alert Pain management: pain level controlled Vital Signs Assessment: post-procedure vital signs reviewed and stable Respiratory status: spontaneous breathing, nonlabored ventilation and respiratory function stable Cardiovascular status: blood pressure returned to baseline and stable Postop Assessment: no apparent nausea or vomiting Anesthetic complications: no   No notable events documented.   Last Vitals:  Vitals:   03/09/24 1515 03/09/24 1528  BP: 101/61 (!) 99/45  Pulse: 63 72  Resp: 11 14  Temp: (!) 36.2 C (!) 36.2 C  SpO2: 98% 96%    Last Pain:  Vitals:   03/09/24 1528  TempSrc: Temporal  PainSc: 0-No pain                 Camellia Merilee Louder

## 2024-03-10 ENCOUNTER — Encounter: Payer: Self-pay | Admitting: Urology

## 2024-03-13 ENCOUNTER — Encounter: Payer: Self-pay | Admitting: Urology

## 2024-03-22 ENCOUNTER — Other Ambulatory Visit: Payer: Self-pay | Admitting: Urology

## 2024-04-19 ENCOUNTER — Other Ambulatory Visit: Payer: Self-pay | Admitting: Urology

## 2024-04-19 DIAGNOSIS — N401 Enlarged prostate with lower urinary tract symptoms: Secondary | ICD-10-CM

## 2024-06-15 ENCOUNTER — Other Ambulatory Visit: Payer: Self-pay

## 2024-06-15 DIAGNOSIS — N401 Enlarged prostate with lower urinary tract symptoms: Secondary | ICD-10-CM

## 2024-06-15 DIAGNOSIS — R972 Elevated prostate specific antigen [PSA]: Secondary | ICD-10-CM

## 2024-06-22 ENCOUNTER — Other Ambulatory Visit: Payer: Self-pay

## 2024-06-22 DIAGNOSIS — N401 Enlarged prostate with lower urinary tract symptoms: Secondary | ICD-10-CM

## 2024-06-22 DIAGNOSIS — R972 Elevated prostate specific antigen [PSA]: Secondary | ICD-10-CM

## 2024-06-22 NOTE — Progress Notes (Unsigned)
 06/24/2024 3:24 PM   Danny Brown February 27, 1956 [id number]  Referring provider: [facility] [address] [place],  [place] [address]  Urological history: 1. Erectile dysfunction -contributing factors of age, diabetes, HTN, HLD and BP meds.  -sildenafil - headaches  -tadalafil  20 mg, on-demand-dosing  2. BPH with LU TS -PSA (05/2024) - 0.9 -cysto (04/2023) -mild lateral lobe enlargement and mild elevation of the bladder neck -TRUS (04/2023) - 33 gram prostate   3. Varicocele - scrotal US  (09/2023) Small right varicocele  4. 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 Nephrolithiasis - left URS (02/2024)   Chief Complaint  Patient presents with   Erectile Dysfunction   HPI: Danny Brown is a 68 y.o. man who presents today for yearly follow with his wife, Danny Brown.   Previous records reviewed.    I PSS 5/0  He reports urinary frequency, urinary urgency, and nocturia x 1.  Patient denies any modifying or aggravating factors.  Patient denies any recent UTI's, gross hematuria, dysuria or suprapubic/flank pain.  Patient denies any fevers, chills, nausea or vomiting.    His father had fatal prostate cancer.   PVR 146 mL   PSA (04/2024) 0.66 and 0.9   Serum creatinine (04/2024) 1.1  Hemoglobin A1c (04/2024) 6.7  BPH meds: tamsulosin  0.4 mg daily   He drinks 64 ounces of water  daily.  SHIM 21  He does not have confidence that he could get and keep an erection.  Patient still having spontaneous erections.  He denies any pain or curvature with erections.    TSH (01/2024) 1.03   He says satisfactory erections with tadalafil  5 mg daily and tadalafil  20 mg on-demand dosing  PMH: Past Medical History:  Diagnosis Date   Anginal pain    Aortic atherosclerosis    Coronary artery disease involving native coronary artery of native heart without  angina pectoris    a.) LHC/PCI 06/19/2023: 95% ulcerated lesion p-mLAD, 50% mid LAD --> (2.5 x 26 mm Frontier Onyx DES mLAD)   Dyspnea    Erectile dysfunction    a.) on PDE5i (tadalafil )   Essential hypertension    GERD (gastroesophageal reflux disease)    Gilbert syndrome    Heart murmur    Hiatal hernia    Hypercholesterolemia    Left ureteral stone 02/2024   MDD (major depressive disorder)    Mixed hyperlipidemia    Obstructive sleep apnea on CPAP    Palpitations    Pneumonia    Right lower lobe pulmonary nodule 02/2024   SNHL (sensorineural hearing loss)    T2DM (type 2 diabetes mellitus) (HCC)    Tinnitus     Surgical History: Past Surgical History:  Procedure Laterality Date   COLONOSCOPY     CORONARY STENT INTERVENTION N/A 06/19/2023   Procedure: CORONARY STENT INTERVENTION;  Surgeon: Florencio Cara BIRCH, MD;  Location: [facility];  Service: Cardiovascular;  Laterality: N/A;   CYSTOSCOPY W/ RETROGRADES N/A 03/09/2024   Procedure: CYSTOSCOPY, WITH RETROGRADE PYELOGRAM;  Surgeon: Francisca Redell BROCKS, MD;  Location: [facility];  Service: Urology;  Laterality: N/A;   CYSTOSCOPY WITH RETROGRADE PYELOGRAM, URETEROSCOPY AND STENT PLACEMENT Left 02/24/2024   Procedure: CYSTOURETEROSCOPY, WITH RETROGRADE PYELOGRAM AND STENT INSERTION;  Surgeon: Francisca Redell BROCKS, MD;  Location: [facility];  Service: Urology;  Laterality: Left;   CYSTOSCOPY/URETEROSCOPY/HOLMIUM LASER/STENT PLACEMENT Left 03/09/2024   Procedure:  CYSTOSCOPY/URETEROSCOPY/HOLMIUM LASER/STENT PLACEMENT;  Surgeon: Francisca Redell BROCKS, MD;  Location: [facility];  Service: Urology;  Laterality: Left;  EXCHANGE   LEFT HEART CATH AND CORONARY ANGIOGRAPHY Left 06/19/2023   Procedure: LEFT HEART CATH AND CORONARY ANGIOGRAPHY;  Surgeon: Florencio Cara BIRCH, MD;  Location: [facility];  Service: Cardiovascular;  Laterality: Left;   PALATE / UVULA BIOPSY / EXCISION  07/14/2015   uvula removed   Uvula Removed     VASECTOMY       Home Medications:  Allergies as of 06/24/2024       Reactions   Atorvastatin Other (See Comments)   Joint pain   Penicillins Itching   Rosuvastatin Other (See Comments)   Joint pain        Medication List        Accurate as of June 24, 2024  3:24 PM. If you have any questions, ask your nurse or doctor.          acetaminophen  500 MG tablet Commonly known as: TYLENOL  Take 1,000 mg by mouth every 6 (six) hours as needed for moderate pain (pain score 4-6).   alfuzosin  10 MG 24 hr tablet Commonly known as: UROXATRAL  TAKE ONE TABLET BY MOUTH ONE TIME DAILY WITH BREAKFAST   aspirin  EC 81 MG tablet Take 81 mg by mouth daily. Swallow whole.   Contour Next Test test strip Generic drug: glucose blood TEST BLOOD SUGAR TWICE A DAY   ezetimibe 10 MG tablet Commonly known as: ZETIA Take 10 mg by mouth daily.   fluorouracil 5 % cream Commonly known as: EFUDEX Apply to wart nightly   metFORMIN 750 MG 24 hr tablet Commonly known as: GLUCOPHAGE-XR Take 750 mg by mouth daily with breakfast.   metoprolol  succinate 25 MG 24 hr tablet Commonly known as: TOPROL -XL Take 12.5 mg by mouth daily.   NON FORMULARY Pt uses a cpap nightly   pravastatin 40 MG tablet Commonly known as: PRAVACHOL Take 40 mg by mouth at bedtime.   predniSONE 5 MG tablet Commonly known as: DELTASONE Take as directed - 6 day taper; take as needed pain flareup   tadalafil  5 MG tablet Commonly known as: CIALIS  Take 5 mg by mouth.   tadalafil  20 MG tablet Commonly known as: CIALIS  Take 1 tablet (20 mg total) by mouth daily as needed for erectile dysfunction.   Vilazodone HCl 40 MG Tabs Commonly known as: VIIBRYD Take 40 mg by mouth daily.        Allergies:  Allergies  Allergen Reactions   Atorvastatin Other (See Comments)    Joint pain   Penicillins Itching   Rosuvastatin Other (See Comments)    Joint pain    Family History: History reviewed. No pertinent family  history.  Social History:  reports that he has quit smoking. His smoking use included cigarettes. He has never used smokeless tobacco. He reports current alcohol use. He reports that he does not use drugs.  ROS: Pertinent ROS in HPI  Physical Exam: BP 118/66   Pulse 73   Ht 5' 3 (1.6 m)   Wt 150 lb (68 kg)   BMI 26.57 kg/m   Constitutional:  Well nourished. Alert and oriented, No acute distress. HEENT: [place] AT, moist mucus membranes.  Trachea midline Cardiovascular: No clubbing, cyanosis, or edema. Respiratory: Normal respiratory effort, no increased work of breathing. Neurologic: Grossly intact, no focal deficits, moving all 4 extremities. Psychiatric: Normal mood and affect.   Laboratory Data: See Epic and HPI  I have reviewed the labs.  See HPI.       Pertinent Imaging:  06/24/24 15:03  Scan Result     Assessment & Plan:    1. Erectile dysfunction - He is having satisfactory intercourse with tadalafil  5 mg daily and tadalafil  20 mg on-demand dosing  2. BPH with LU TS - mild symptoms and he is delighted - PSA up to date  - PVR < 300 cc  - encouraged avoiding bladder irritants, fluid restriction before bedtime and timed voiding's - Continue alfuzosin  10 mg daily and Cialis  5 mg daily - return to clinic in 12 months symptom re-evaluation   3. Nephrolithiasis - KUB next year   4. Family history of prostate cancer - He would like to keep a closer eye on his PSA since his father died from prostate cancer, we will obtain a PSA yearly and his PCP will obtain that yearly but we will schedule it so that will be at 39-month intervals  Return in about 1 year (around 06/24/2025) for KUB, PSA, I PSS, SHIM .  These notes generated with voice recognition software. I apologize for typographical errors.  CLOTILDA HELON RIGGERS  [facility] [address]  [address] [place], [place] [address] [phone]

## 2024-06-23 LAB — PSA: Prostate Specific Ag, Serum: 0.9 ng/mL (ref 0.0–4.0)

## 2024-06-24 ENCOUNTER — Ambulatory Visit: Payer: Self-pay | Admitting: Urology

## 2024-06-24 ENCOUNTER — Encounter: Payer: Self-pay | Admitting: Urology

## 2024-06-24 VITALS — BP 118/66 | HR 73 | Ht 63.0 in | Wt 150.0 lb

## 2024-06-24 DIAGNOSIS — R351 Nocturia: Secondary | ICD-10-CM

## 2024-06-24 DIAGNOSIS — R350 Frequency of micturition: Secondary | ICD-10-CM

## 2024-06-24 DIAGNOSIS — N200 Calculus of kidney: Secondary | ICD-10-CM

## 2024-06-24 DIAGNOSIS — N529 Male erectile dysfunction, unspecified: Secondary | ICD-10-CM | POA: Diagnosis not present

## 2024-06-24 DIAGNOSIS — R3915 Urgency of urination: Secondary | ICD-10-CM | POA: Diagnosis not present

## 2024-06-24 DIAGNOSIS — N401 Enlarged prostate with lower urinary tract symptoms: Secondary | ICD-10-CM

## 2024-06-24 DIAGNOSIS — Z8042 Family history of malignant neoplasm of prostate: Secondary | ICD-10-CM

## 2024-06-24 DIAGNOSIS — N138 Other obstructive and reflux uropathy: Secondary | ICD-10-CM

## 2024-06-24 LAB — BLADDER SCAN AMB NON-IMAGING

## 2024-06-24 MED ORDER — TADALAFIL 20 MG PO TABS: 20.0000 mg | ORAL_TABLET | Freq: Every day | ORAL | 3 refills | Status: AC | PRN

## 2024-07-20 ENCOUNTER — Other Ambulatory Visit: Payer: Self-pay | Admitting: Urology

## 2024-08-20 ENCOUNTER — Other Ambulatory Visit: Payer: Self-pay

## 2024-09-08 ENCOUNTER — Ambulatory Visit: Admit: 2024-09-08

## 2025-06-24 ENCOUNTER — Ambulatory Visit: Admitting: Urology
